# Patient Record
Sex: Male | Born: 1995 | Race: White | Hispanic: No | Marital: Single | Smoking: Current every day smoker
Health system: Southern US, Community
[De-identification: ages and names within clinical notes are randomized; demographics above are authoritative.]

## PROBLEM LIST (undated history)

## (undated) DIAGNOSIS — W3400XA Accidental discharge from unspecified firearms or gun, initial encounter: Secondary | ICD-10-CM

## (undated) DIAGNOSIS — N289 Disorder of kidney and ureter, unspecified: Secondary | ICD-10-CM

## (undated) DIAGNOSIS — I10 Essential (primary) hypertension: Secondary | ICD-10-CM

## (undated) HISTORY — PX: APPENDECTOMY: SHX54

## (undated) HISTORY — PX: LEG SURGERY: SHX1003

---
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
## 2014-05-03 ENCOUNTER — Encounter (HOSPITAL_COMMUNITY): Payer: Self-pay | Admitting: Neurology

## 2014-05-03 ENCOUNTER — Emergency Department (HOSPITAL_COMMUNITY)
Admission: EM | Admit: 2014-05-03 | Discharge: 2014-05-03 | Disposition: A | Discharge status: Home / Self Care | Payer: 59 | Attending: Emergency Medicine | Admitting: Emergency Medicine

## 2014-05-03 ENCOUNTER — Emergency Department (HOSPITAL_COMMUNITY): Payer: 59

## 2014-05-03 DIAGNOSIS — Z23 Encounter for immunization: Secondary | ICD-10-CM | POA: Diagnosis not present

## 2014-05-03 DIAGNOSIS — Y998 Other external cause status: Secondary | ICD-10-CM | POA: Diagnosis not present

## 2014-05-03 DIAGNOSIS — Y9289 Other specified places as the place of occurrence of the external cause: Secondary | ICD-10-CM | POA: Diagnosis not present

## 2014-05-03 DIAGNOSIS — S51832A Puncture wound without foreign body of left forearm, initial encounter: Secondary | ICD-10-CM | POA: Diagnosis not present

## 2014-05-03 DIAGNOSIS — M79632 Pain in left forearm: Secondary | ICD-10-CM

## 2014-05-03 DIAGNOSIS — Y9389 Activity, other specified: Secondary | ICD-10-CM | POA: Diagnosis not present

## 2014-05-03 DIAGNOSIS — W320XXA Accidental handgun discharge, initial encounter: Secondary | ICD-10-CM | POA: Insufficient documentation

## 2014-05-03 DIAGNOSIS — W3400XA Accidental discharge from unspecified firearms or gun, initial encounter: Secondary | ICD-10-CM

## 2014-05-03 LAB — COMPREHENSIVE METABOLIC PANEL
ALT: 46 U/L (ref 0–53)
AST: 30 U/L (ref 0–37)
Albumin: 4.7 g/dL (ref 3.5–5.2)
Alkaline Phosphatase: 80 U/L (ref 39–117)
Anion gap: 18 — ABNORMAL HIGH (ref 5–15)
BILIRUBIN TOTAL: 0.9 mg/dL (ref 0.3–1.2)
BUN: 9 mg/dL (ref 6–23)
CHLORIDE: 99 meq/L (ref 96–112)
CO2: 22 meq/L (ref 19–32)
CREATININE: 0.86 mg/dL (ref 0.50–1.35)
Calcium: 10 mg/dL (ref 8.4–10.5)
GLUCOSE: 96 mg/dL (ref 70–99)
Potassium: 3.9 mEq/L (ref 3.7–5.3)
Sodium: 139 mEq/L (ref 137–147)
Total Protein: 7.9 g/dL (ref 6.0–8.3)

## 2014-05-03 LAB — TYPE AND SCREEN
ABO/RH(D): O POS
Antibody Screen: NEGATIVE

## 2014-05-03 LAB — CBC
HEMATOCRIT: 48.5 % (ref 39.0–52.0)
HEMOGLOBIN: 17.7 g/dL — AB (ref 13.0–17.0)
MCH: 32.4 pg (ref 26.0–34.0)
MCHC: 36.5 g/dL — ABNORMAL HIGH (ref 30.0–36.0)
MCV: 88.7 fL (ref 78.0–100.0)
PLATELETS: 285 10*3/uL (ref 150–400)
RBC: 5.47 MIL/uL (ref 4.22–5.81)
RDW: 12.3 % (ref 11.5–15.5)
WBC: 6.7 10*3/uL (ref 4.0–10.5)

## 2014-05-03 LAB — ETHANOL

## 2014-05-03 LAB — LACTIC ACID, PLASMA
Lactic Acid, Venous: 2.7 mmol/L — ABNORMAL HIGH (ref 0.5–2.2)
Lactic Acid, Venous: 1.4 mmol/L (ref 0.5–2.2)

## 2014-05-03 LAB — ABO/RH: ABO/RH(D): O POS

## 2014-05-03 MED ORDER — OXYCODONE-ACETAMINOPHEN 5-325 MG PO TABS: 1.0000 | ORAL_TABLET | Freq: Four times a day (QID) | ORAL | Status: DC | PRN

## 2014-05-03 MED ORDER — TETANUS-DIPHTH-ACELL PERTUSSIS 5-2.5-18.5 LF-MCG/0.5 IM SUSP
0.5000 mL | Freq: Once | INTRAMUSCULAR | Status: DC
  Filled 2014-05-03: qty 0.5

## 2014-05-03 MED ORDER — FENTANYL CITRATE 0.05 MG/ML IJ SOLN
50.0000 ug | Freq: Once | INTRAMUSCULAR | Status: AC
  Administered 2014-05-03: 50 ug via INTRAVENOUS
  Filled 2014-05-03: qty 2

## 2014-05-03 MED ORDER — FENTANYL CITRATE 0.05 MG/ML IJ SOLN
50.0000 ug | Freq: Once | INTRAMUSCULAR | Status: AC
  Administered 2014-05-03: 50 ug via INTRAVENOUS

## 2014-05-03 MED ORDER — SODIUM CHLORIDE 0.9 % IV BOLUS (SEPSIS)
1000.0000 mL | Freq: Once | INTRAVENOUS | Status: AC
  Administered 2014-05-03: 1000 mL via INTRAVENOUS

## 2014-05-03 MED ORDER — FENTANYL CITRATE 0.05 MG/ML IJ SOLN
INTRAMUSCULAR | Status: AC
  Filled 2014-05-03: qty 2

## 2014-05-03 MED ORDER — CEFAZOLIN SODIUM 1-5 GM-% IV SOLN
1.0000 g | Freq: Once | INTRAVENOUS | Status: AC
  Administered 2014-05-03: 1 g via INTRAVENOUS
  Filled 2014-05-03: qty 50

## 2014-05-03 MED ORDER — TETANUS-DIPHTH-ACELL PERTUSSIS 5-2.5-18.5 LF-MCG/0.5 IM SUSP
0.5000 mL | Freq: Once | INTRAMUSCULAR | Status: AC
  Administered 2014-05-03: 0.5 mL via INTRAMUSCULAR

## 2014-05-03 MED ORDER — CEPHALEXIN 250 MG PO CAPS: 250.0000 mg | ORAL_CAPSULE | Freq: Four times a day (QID) | ORAL | Status: DC

## 2014-05-03 MED ORDER — CEPHALEXIN 250 MG PO CAPS: 250.0000 mg | ORAL_CAPSULE | Freq: Four times a day (QID) | ORAL | Status: AC
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
## 2014-05-03 NOTE — Progress Notes (Signed)
Orthopedic Tech Progress Note Patient Details:  Jeffrey BadJoseph R Woods 1995/08/07 [id number]  Ortho Devices Type of Ortho Device: Ace wrap, Arm sling, Sugartong splint Ortho Device/Splint Location: lue Ortho Device/Splint Interventions: Application   Marsh Heckler 05/03/2014, 4:34 PM
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
## 2014-05-03 NOTE — ED Notes (Signed)
Ortho tech made aware. Is coming down to apply splint.
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
## 2014-05-11 ENCOUNTER — Ambulatory Visit (INDEPENDENT_AMBULATORY_CARE_PROVIDER_SITE_OTHER): Payer: 59 | Admitting: Orthopedic Surgery

## 2014-05-11 ENCOUNTER — Encounter: Payer: Self-pay | Admitting: Orthopedic Surgery

## 2014-05-11 VITALS — BP 134/78 | Ht 72.0 in | Wt 220.0 lb

## 2014-05-11 DIAGNOSIS — W3400XA Accidental discharge from unspecified firearms or gun, initial encounter: Secondary | ICD-10-CM

## 2014-05-11 DIAGNOSIS — T148 Other injury of unspecified body region: Secondary | ICD-10-CM | POA: Diagnosis not present

## 2014-05-11 NOTE — Patient Instructions (Addendum)
Keep taking antibiotics until course is finished  Return to work 05/18/14
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
## 2018-03-27 DIAGNOSIS — I10 Essential (primary) hypertension: Secondary | ICD-10-CM | POA: Diagnosis not present

## 2018-03-27 DIAGNOSIS — R51 Headache: Secondary | ICD-10-CM | POA: Diagnosis not present

## 2018-04-01 DIAGNOSIS — I10 Essential (primary) hypertension: Secondary | ICD-10-CM | POA: Diagnosis not present

## 2018-04-01 DIAGNOSIS — Z6829 Body mass index (BMI) 29.0-29.9, adult: Secondary | ICD-10-CM | POA: Diagnosis not present

## 2018-04-01 DIAGNOSIS — Z1389 Encounter for screening for other disorder: Secondary | ICD-10-CM | POA: Diagnosis not present

## 2018-04-01 DIAGNOSIS — F411 Generalized anxiety disorder: Secondary | ICD-10-CM | POA: Diagnosis not present

## 2018-04-01 DIAGNOSIS — E663 Overweight: Secondary | ICD-10-CM | POA: Diagnosis not present

## 2018-05-27 DIAGNOSIS — Z1389 Encounter for screening for other disorder: Secondary | ICD-10-CM | POA: Diagnosis not present

## 2018-05-27 DIAGNOSIS — E663 Overweight: Secondary | ICD-10-CM | POA: Diagnosis not present

## 2018-05-27 DIAGNOSIS — I10 Essential (primary) hypertension: Secondary | ICD-10-CM | POA: Diagnosis not present

## 2018-05-27 DIAGNOSIS — Z0000 Encounter for general adult medical examination without abnormal findings: Secondary | ICD-10-CM | POA: Diagnosis not present

## 2018-05-27 DIAGNOSIS — Z6829 Body mass index (BMI) 29.0-29.9, adult: Secondary | ICD-10-CM | POA: Diagnosis not present

## 2018-09-01 DIAGNOSIS — M79645 Pain in left finger(s): Secondary | ICD-10-CM | POA: Diagnosis not present

## 2018-09-01 DIAGNOSIS — S0083XA Contusion of other part of head, initial encounter: Secondary | ICD-10-CM | POA: Diagnosis not present

## 2019-01-28 ENCOUNTER — Emergency Department (HOSPITAL_COMMUNITY)
Admission: EM | Admit: 2019-01-28 | Discharge: 2019-01-28 | Discharge status: Against Medical Advice | Payer: 59 | Attending: Emergency Medicine | Admitting: Emergency Medicine

## 2019-01-28 ENCOUNTER — Other Ambulatory Visit: Payer: Self-pay

## 2019-01-28 ENCOUNTER — Encounter (HOSPITAL_COMMUNITY): Payer: Self-pay | Admitting: Emergency Medicine

## 2019-01-28 DIAGNOSIS — E860 Dehydration: Secondary | ICD-10-CM | POA: Insufficient documentation

## 2019-01-28 DIAGNOSIS — F17200 Nicotine dependence, unspecified, uncomplicated: Secondary | ICD-10-CM | POA: Insufficient documentation

## 2019-01-28 DIAGNOSIS — R51 Headache: Secondary | ICD-10-CM | POA: Insufficient documentation

## 2019-01-28 DIAGNOSIS — Z79899 Other long term (current) drug therapy: Secondary | ICD-10-CM | POA: Insufficient documentation

## 2019-01-28 DIAGNOSIS — R519 Headache, unspecified: Secondary | ICD-10-CM

## 2019-01-28 DIAGNOSIS — I10 Essential (primary) hypertension: Secondary | ICD-10-CM | POA: Insufficient documentation

## 2019-01-28 DIAGNOSIS — Z5320 Procedure and treatment not carried out because of patient's decision for unspecified reasons: Secondary | ICD-10-CM | POA: Insufficient documentation

## 2019-01-28 HISTORY — DX: Essential (primary) hypertension: I10

## 2019-01-28 LAB — BASIC METABOLIC PANEL
Anion gap: 10 (ref 5–15)
BUN: 15 mg/dL (ref 6–20)
CO2: 26 mmol/L (ref 22–32)
Calcium: 9.1 mg/dL (ref 8.9–10.3)
Chloride: 100 mmol/L (ref 98–111)
Creatinine, Ser: 1.03 mg/dL (ref 0.61–1.24)
GFR calc Af Amer: >60 mL/min (ref >60)
GFR calc non Af Amer: >60 mL/min (ref >60)
Glucose, Bld: 101 mg/dL — ABNORMAL HIGH (ref 70–99)
Potassium: 3.8 mmol/L (ref 3.5–5.1)
Sodium: 136 mmol/L (ref 135–145)

## 2019-01-28 LAB — CBC
HCT: 45.6 % (ref 39.0–52.0)
Hemoglobin: 16.1 g/dL (ref 13.0–17.0)
MCH: 31.6 pg (ref 26.0–34.0)
MCHC: 35.3 g/dL (ref 30.0–36.0)
MCV: 89.6 fL (ref 80.0–100.0)
Platelets: 278 10*3/uL (ref 150–400)
RBC: 5.09 MIL/uL (ref 4.22–5.81)
RDW: 11.9 % (ref 11.5–15.5)
WBC: 11.5 10*3/uL — ABNORMAL HIGH (ref 4.0–10.5)
nRBC: 0 % (ref 0.0–0.2)

## 2019-01-28 NOTE — Discharge Instructions (Signed)
Your blood pressure is elevated at 143/95. Please have this rechecked soon. You have elected to leave the Emergency Dept against medical advice. If your symptoms change or your have new problems, please return to the Emergency Dept for evaluation and care.

## 2019-01-28 NOTE — ED Provider Notes (Signed)
[facility] Provider [facility]   CSN: [id number] Arrival date & time: 01/28/19  1950     History   Chief Complaint Chief Complaint  Patient presents with  . Headache    HPI Danford BadJoseph R Demirjian is a 23 y.o. male.     Patient is a 23 year old male who presents to the emergency department with a complaint of headache and weakness.  The patient states that this problem started earlier today.  He says that on yesterday he was feeling fine and not having any problems.  The patient states that he has been working in a very hot attic.  He is not sure that he has been keeping himself well-hydrated today.  He started having problems with a headache, feeling weak and tired.  He went home, laid down to take a nap, but noticed that he was very weak.  He took a nap but upon awakening noted some neck pain from the lower neck extending up to the house base of the head.  He says he is also had some problems with constipation.  There is been no recent vomiting.  He says he has felt nauseated.  There is been no fever or chills reported.  There has been some neck pain, but no neck stiffness or rigidity.  There is been no unusual rash reported.  The patient states that he gets headaches from time to time.  This 1 seem to have been a little worse than usual, and he was particularly concerned because he was having the headache and having weakness.  Patient was brought to the emergency department by EMS.  Patient received IV fluids while in route to the emergency department.  The history is provided by the patient.  Headache Associated symptoms: fatigue, nausea and neck pain   Associated symptoms: no abdominal pain, no back pain, no congestion, no cough, no diarrhea, no dizziness, no ear pain, no eye pain, no fever, no myalgias, no numbness, no photophobia, no seizures, no vomiting and no weakness     Past Medical History:  Diagnosis Date  . Hypertension     Patient Active Problem List   Diagnosis Date Noted  . Difficulty in walking(719.7) 02/28/2011  . Stiffness of joint, not elsewhere classified, ankle and foot 02/28/2011  . Pain in joint, lower leg 02/28/2011    Past Surgical History:  Procedure Laterality Date  . LEG SURGERY          Home Medications    Prior to Admission medications   Medication Sig Start Date End Date Taking? Authorizing Provider  ibuprofen (ADVIL,MOTRIN) 200 MG tablet Take 800 mg by mouth every 6 (six) hours as needed for headache.   Yes [provider]  lisinopril (ZESTRIL) 10 MG tablet Take 10 mg by mouth daily.   Yes [provider]    Family History No family history on file.  Social History Social History   Tobacco Use  . Smoking status: Current Some Day Smoker  . Smokeless tobacco: Never Used  Substance Use Topics  . Alcohol use: Yes  . Drug use: Not on file     Allergies   Patient has no known allergies.   Review of Systems Review of Systems  Constitutional: Positive for fatigue. Negative for activity change, appetite change and fever.  HENT: Negative for congestion, ear discharge, ear pain, facial swelling, nosebleeds, rhinorrhea, sneezing and tinnitus.   Eyes: Negative for photophobia, pain and discharge.  Respiratory: Negative for cough, choking, shortness of breath  and wheezing.   Cardiovascular: Negative for chest pain, palpitations and leg swelling.  Gastrointestinal: Positive for constipation and nausea. Negative for abdominal pain, blood in stool, diarrhea and vomiting.  Genitourinary: Negative for difficulty urinating, dysuria, flank pain, frequency and hematuria.  Musculoskeletal: Positive for neck pain. Negative for back pain, gait problem and myalgias.  Skin: Negative for color change, rash and wound.  Neurological: Positive for headaches. Negative for dizziness, seizures, syncope, facial asymmetry, speech difficulty, weakness and numbness.  Hematological: Negative for adenopathy. Does  not bruise/bleed easily.  Psychiatric/Behavioral: Negative for agitation, confusion, hallucinations, self-injury and suicidal ideas. The patient is not nervous/anxious.      Physical Exam Updated Vital Signs BP (!) 143/95 (BP Location: Left Arm)   Pulse 74   Temp 98.7 F (37.1 C) (Oral)   Resp 18   Ht 6\' 1"  (1.854 m)   Wt 104.3 kg   SpO2 100%   BMI 30.34 kg/m   Physical Exam Vitals signs and nursing note reviewed.  Constitutional:      General: He is not in acute distress.    Appearance: He is well-developed.  HENT:     Head: Normocephalic and atraumatic.     Right Ear: External ear normal.     Left Ear: External ear normal.  Eyes:     General: No scleral icterus.       Right eye: No discharge.        Left eye: No discharge.     Conjunctiva/sclera: Conjunctivae normal.  Neck:     Musculoskeletal: Neck supple.     Trachea: No tracheal deviation.  Cardiovascular:     Rate and Rhythm: Normal rate and regular rhythm.  Pulmonary:     Effort: Pulmonary effort is normal. No respiratory distress.     Breath sounds: Normal breath sounds. No stridor. No wheezing or rales.  Abdominal:     General: Bowel sounds are normal. There is no distension.     Palpations: Abdomen is soft.     Tenderness: There is no abdominal tenderness. There is no guarding or rebound.  Musculoskeletal:        General: No tenderness.  Skin:    General: Skin is warm and dry.     Findings: No rash.  Neurological:     Mental Status: He is alert.     Cranial Nerves: No cranial nerve deficit (no facial droop, extraocular movements intact, no slurred speech).     Sensory: No sensory deficit.     Motor: No abnormal muscle tone or seizure activity.     Coordination: Coordination normal.      ED Treatments / Results  Labs (all labs ordered are listed, but only abnormal results are displayed) Labs Reviewed  CBC - Abnormal; Notable for the following components:      Result Value   WBC 11.5 (*)     All other components within normal limits  BASIC METABOLIC PANEL - Abnormal; Notable for the following components:   Glucose, Bld 101 (*)    All other components within normal limits    EKG None  Radiology No results found.  Procedures Procedures (including critical care time)  Medications Ordered in ED Medications - No data to display   Initial Impression / Assessment and Plan / ED Course  I have reviewed the triage vital signs and the nursing notes.  Pertinent labs & imaging results that were available during my care of the patient were reviewed by me and considered in my  medical decision making (see chart for details).          Final Clinical Impressions(s) / ED Diagnoses MDM  Blood pressure is elevated at 143/95, otherwise vital signs are within normal limits.  No neurologic deficits appreciated.  No vascular deficits appreciated.  No rigidity or other signs of meningitis or other acute changes.  After having received the IV fluids, the patient states that he feels much much better.  I discussed with the patient the importance of having the orthostatic blood pressure checks, to review his lab work, and to a COVID-19 virus screen.  The patient initially was in favor of having this test done.  As I was preparing to put in the orders, the nurse came to me and reported that the patient wanted to leave.  He says that he is hungry and he wants to return home.  I discussed with the patient the importance of the information that we wanted to pain.  I discussed with him the possible dangers.  The patient states that he accepts those responsibilities but he wants to go home.  Patient left the emergency department [place].  The lab work and had been obtained earlier upon his arrival to the emergency department returned showing the complete blood count with an slight elevation of the white blood cells of 11,500, no shift to the left.  Otherwise the test is within normal  limits. The basic metabolic panel shows no acute abnormality.  The anion gap is normal at 10.  Patient is ambulatory at the time of leaving the emergency department without problem.  Patient was invited to return to the emergency department if any changes in his condition, problems, or concerns.   Final diagnoses:  Dehydration  Bad headache    ED Discharge Orders    None       Lily Kocher, Hershal Coria 01/29/19 1213    Virgel Manifold, MD 01/30/19 [phone]

## 2019-01-28 NOTE — ED Notes (Signed)
Pt stating he wants to leave, because "he feels fine." EDP Lily Kocher, PA made aware and went in to speak to him. Pt still stating he still wanted to leave

## 2019-01-28 NOTE — ED Triage Notes (Addendum)
Pt C/o headache, weakness, neck pain, and constipation. Denies fevers and vomiting.

## 2019-05-26 ENCOUNTER — Encounter (HOSPITAL_COMMUNITY): Payer: Self-pay

## 2019-05-26 ENCOUNTER — Emergency Department (HOSPITAL_COMMUNITY)
Admission: EM | Admit: 2019-05-26 | Discharge: 2019-05-26 | Disposition: A | Discharge status: Home / Self Care | Payer: Self-pay | Attending: Emergency Medicine | Admitting: Emergency Medicine

## 2019-05-26 ENCOUNTER — Other Ambulatory Visit: Payer: Self-pay

## 2019-05-26 DIAGNOSIS — G8929 Other chronic pain: Secondary | ICD-10-CM

## 2019-05-26 DIAGNOSIS — I10 Essential (primary) hypertension: Secondary | ICD-10-CM | POA: Insufficient documentation

## 2019-05-26 DIAGNOSIS — F17290 Nicotine dependence, other tobacco product, uncomplicated: Secondary | ICD-10-CM | POA: Insufficient documentation

## 2019-05-26 DIAGNOSIS — R519 Headache, unspecified: Secondary | ICD-10-CM | POA: Insufficient documentation

## 2019-05-26 DIAGNOSIS — Z79899 Other long term (current) drug therapy: Secondary | ICD-10-CM | POA: Insufficient documentation

## 2019-05-26 HISTORY — DX: Accidental discharge from unspecified firearms or gun, initial encounter: W34.00XA

## 2019-05-26 LAB — I-STAT CHEM 8, ED
BUN: 8 mg/dL (ref 6–20)
Calcium, Ion: 1.2 mmol/L (ref 1.15–1.40)
Chloride: 103 mmol/L (ref 98–111)
Creatinine, Ser: 0.8 mg/dL (ref 0.61–1.24)
Glucose, Bld: 86 mg/dL (ref 70–99)
HCT: 44 % (ref 39.0–52.0)
Hemoglobin: 15 g/dL (ref 13.0–17.0)
Potassium: 3.8 mmol/L (ref 3.5–5.1)
Sodium: 140 mmol/L (ref 135–145)
TCO2: 26 mmol/L (ref 22–32)

## 2019-05-26 MED ORDER — SODIUM CHLORIDE 0.9 % IV BOLUS
500.0000 mL | Freq: Once | INTRAVENOUS | Status: AC
Start: 2019-05-26 — End: 2019-05-26
  Administered 2019-05-26: 500 mL via INTRAVENOUS

## 2019-05-26 MED ORDER — VERAPAMIL HCL ER 180 MG PO TBCR: 180.0000 mg | EXTENDED_RELEASE_TABLET | Freq: Every day | ORAL | 0 refills | Status: AC

## 2019-05-26 MED ORDER — DIPHENHYDRAMINE HCL 50 MG/ML IJ SOLN
12.5000 mg | Freq: Once | INTRAMUSCULAR | Status: AC
  Administered 2019-05-26: 12.5 mg via INTRAVENOUS
  Filled 2019-05-26: qty 1

## 2019-05-26 MED ORDER — ONDANSETRON HCL 4 MG/2ML IJ SOLN
4.0000 mg | Freq: Once | INTRAMUSCULAR | Status: AC
  Administered 2019-05-26: 4 mg via INTRAVENOUS
  Filled 2019-05-26: qty 2

## 2019-05-26 MED ORDER — KETOROLAC TROMETHAMINE 15 MG/ML IJ SOLN
15.0000 mg | Freq: Once | INTRAMUSCULAR | Status: AC
  Administered 2019-05-26: 15 mg via INTRAVENOUS
  Filled 2019-05-26: qty 1

## 2019-05-26 NOTE — ED Triage Notes (Signed)
Pt presents to ED with complaints of hypertension. Pt states his BP was 168/100 this am. Pt states he has had a headache today also.

## 2019-05-26 NOTE — Discharge Instructions (Addendum)
Please take your newly increased verapamil, as prescribed.  Please schedule follow-up appointment with your primary care provider for ongoing evaluation and management of your hypertension.    Please discontinue smoking your vape and as that may be contributing factor.  Also encourage you to follow-up with your PCP regarding your reported anxiety.  Given your musculoskeletal discomfort, encourage lifestyle modifications to curb stress on your trapezial muscles.  Consider massage and continue ibuprofen, as needed.  Please return to the ED or seek medical attention should he develop any fevers or chills, new or worsening chest pain or shortness of breath symptoms, or neurologic deficits.
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
## 2019-09-08 ENCOUNTER — Ambulatory Visit (INDEPENDENT_AMBULATORY_CARE_PROVIDER_SITE_OTHER): Payer: Self-pay

## 2019-09-08 ENCOUNTER — Ambulatory Visit
Admission: EM | Admit: 2019-09-08 | Discharge: 2019-09-08 | Disposition: A | Discharge status: Home / Self Care | Payer: Self-pay | Attending: Emergency Medicine | Admitting: Emergency Medicine

## 2019-09-08 DIAGNOSIS — M79671 Pain in right foot: Secondary | ICD-10-CM

## 2019-09-08 DIAGNOSIS — M25571 Pain in right ankle and joints of right foot: Secondary | ICD-10-CM

## 2019-09-08 MED ORDER — PREDNISONE 10 MG PO TABS: 20.0000 mg | ORAL_TABLET | Freq: Every day | ORAL | 0 refills | Status: DC

## 2019-09-08 MED ORDER — IBUPROFEN 800 MG PO TABS: 800.0000 mg | ORAL_TABLET | Freq: Three times a day (TID) | ORAL | 0 refills | Status: DC

## 2019-09-08 NOTE — ED Triage Notes (Signed)
Pt injures right foot 1 week ago, pain not improved  , pain is 4th and 5th toe
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
## 2019-09-08 NOTE — Discharge Instructions (Signed)
X-ray is negative for acute fracture or dislocation. Ibuprofen 800 mg was prescribed Prednisone was prescribed Follow RICE instructions as attached Follow-up with primary care Return for worsening of symptoms

## 2019-10-05 ENCOUNTER — Encounter (HOSPITAL_COMMUNITY): Payer: Self-pay

## 2019-10-05 ENCOUNTER — Emergency Department (HOSPITAL_COMMUNITY): Payer: No Typology Code available for payment source

## 2019-10-05 ENCOUNTER — Emergency Department (HOSPITAL_COMMUNITY)
Admission: EM | Admit: 2019-10-05 | Discharge: 2019-10-06 | Disposition: A | Discharge status: Home / Self Care | Payer: No Typology Code available for payment source | Attending: Emergency Medicine | Admitting: Emergency Medicine

## 2019-10-05 DIAGNOSIS — I10 Essential (primary) hypertension: Secondary | ICD-10-CM | POA: Diagnosis not present

## 2019-10-05 DIAGNOSIS — Z79899 Other long term (current) drug therapy: Secondary | ICD-10-CM | POA: Insufficient documentation

## 2019-10-05 DIAGNOSIS — Y999 Unspecified external cause status: Secondary | ICD-10-CM | POA: Insufficient documentation

## 2019-10-05 DIAGNOSIS — S99911A Unspecified injury of right ankle, initial encounter: Secondary | ICD-10-CM | POA: Diagnosis present

## 2019-10-05 DIAGNOSIS — Z87891 Personal history of nicotine dependence: Secondary | ICD-10-CM | POA: Diagnosis not present

## 2019-10-05 DIAGNOSIS — Y9389 Activity, other specified: Secondary | ICD-10-CM | POA: Insufficient documentation

## 2019-10-05 DIAGNOSIS — M546 Pain in thoracic spine: Secondary | ICD-10-CM | POA: Insufficient documentation

## 2019-10-05 DIAGNOSIS — S4991XA Unspecified injury of right shoulder and upper arm, initial encounter: Secondary | ICD-10-CM | POA: Insufficient documentation

## 2019-10-05 DIAGNOSIS — Y92410 Unspecified street and highway as the place of occurrence of the external cause: Secondary | ICD-10-CM | POA: Insufficient documentation

## 2019-10-05 DIAGNOSIS — S82841A Displaced bimalleolar fracture of right lower leg, initial encounter for closed fracture: Secondary | ICD-10-CM | POA: Diagnosis not present

## 2019-10-05 MED ORDER — ONDANSETRON HCL 4 MG/2ML IJ SOLN
4.0000 mg | Freq: Once | INTRAMUSCULAR | Status: AC
  Administered 2019-10-05: 4 mg via INTRAVENOUS
  Filled 2019-10-05: qty 2

## 2019-10-05 MED ORDER — HYDROMORPHONE HCL 1 MG/ML IJ SOLN
1.0000 mg | Freq: Once | INTRAMUSCULAR | Status: AC
  Administered 2019-10-05: 1 mg via INTRAVENOUS
  Filled 2019-10-05: qty 1
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
## 2019-10-05 NOTE — ED Notes (Signed)
C-Collar removed by [employer]

## 2019-10-06 MED ORDER — HYDROMORPHONE HCL 1 MG/ML IJ SOLN
1.0000 mg | Freq: Once | INTRAMUSCULAR | Status: AC
  Administered 2019-10-06: 1 mg via INTRAVENOUS
  Filled 2019-10-06: qty 1

## 2019-10-06 MED ORDER — OXYCODONE-ACETAMINOPHEN 10-325 MG PO TABS: 1.0000 | ORAL_TABLET | ORAL | 0 refills | Status: DC | PRN

## 2019-10-06 NOTE — ED Notes (Signed)
Ortho tech paged  

## 2019-10-06 NOTE — Progress Notes (Signed)
Orthopedic Tech Progress Note Patient Details:  Jeffrey Woods [birth year] [id number]  Ortho Devices Type of Ortho Device: Crutches, Stirrup splint, Post (short leg) splint, Sling immobilizer Ortho Device/Splint Location: rue. rle. Ortho Device/Splint Interventions: Ordered, Application, Adjustment   Post Interventions Patient Tolerated: Well Instructions Provided: Care of device, Adjustment of device   [name] Post 10/06/2019, 3:28 AM

## 2019-10-06 NOTE — ED Notes (Signed)
Patient verbalizes understanding of discharge instructions. Opportunity for questioning and answers were provided. Armband removed by staff, pt discharged from ED. Pt. ambulatory and discharged home.  

## 2019-10-06 NOTE — Discharge Instructions (Addendum)
Return here as needed.  Call the orthopedic doctor provided first thing in the morning for an appointment.
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
## 2019-10-08 ENCOUNTER — Other Ambulatory Visit (HOSPITAL_COMMUNITY): Payer: Self-pay | Admitting: Orthopedic Surgery

## 2019-10-10 ENCOUNTER — Other Ambulatory Visit: Payer: Self-pay

## 2019-10-10 ENCOUNTER — Encounter (HOSPITAL_BASED_OUTPATIENT_CLINIC_OR_DEPARTMENT_OTHER): Payer: Self-pay | Admitting: Orthopedic Surgery

## 2019-10-14 ENCOUNTER — Other Ambulatory Visit: Payer: Self-pay

## 2019-10-14 ENCOUNTER — Encounter (HOSPITAL_COMMUNITY): Payer: Self-pay | Admitting: *Deleted

## 2019-10-14 DIAGNOSIS — K5903 Drug induced constipation: Secondary | ICD-10-CM | POA: Insufficient documentation

## 2019-10-14 DIAGNOSIS — I10 Essential (primary) hypertension: Secondary | ICD-10-CM | POA: Insufficient documentation

## 2019-10-14 DIAGNOSIS — Z79899 Other long term (current) drug therapy: Secondary | ICD-10-CM | POA: Insufficient documentation

## 2019-10-14 DIAGNOSIS — F17210 Nicotine dependence, cigarettes, uncomplicated: Secondary | ICD-10-CM | POA: Insufficient documentation
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
## 2019-10-15 ENCOUNTER — Emergency Department (HOSPITAL_COMMUNITY)
Admission: EM | Admit: 2019-10-15 | Discharge: 2019-10-15 | Disposition: A | Discharge status: Home / Self Care | Payer: Self-pay | Attending: Emergency Medicine | Admitting: Emergency Medicine

## 2019-10-15 ENCOUNTER — Emergency Department (HOSPITAL_COMMUNITY): Payer: Self-pay

## 2019-10-15 ENCOUNTER — Other Ambulatory Visit: Payer: Self-pay

## 2019-10-15 ENCOUNTER — Other Ambulatory Visit (HOSPITAL_COMMUNITY)
Admission: RE | Admit: 2019-10-15 | Discharge: 2019-10-15 | Disposition: A | Discharge status: Home / Self Care | Payer: HRSA Program | Source: Ambulatory Visit | Attending: Orthopedic Surgery | Admitting: Orthopedic Surgery

## 2019-10-15 DIAGNOSIS — Z01812 Encounter for preprocedural laboratory examination: Secondary | ICD-10-CM | POA: Diagnosis present

## 2019-10-15 DIAGNOSIS — Z20822 Contact with and (suspected) exposure to covid-19: Secondary | ICD-10-CM | POA: Diagnosis not present

## 2019-10-15 DIAGNOSIS — Z4789 Encounter for other orthopedic aftercare: Secondary | ICD-10-CM

## 2019-10-15 DIAGNOSIS — K5903 Drug induced constipation: Secondary | ICD-10-CM

## 2019-10-15 MED ORDER — BISACODYL 10 MG RE SUPP
10.0000 mg | Freq: Once | RECTAL | Status: AC
  Administered 2019-10-15: 10 mg via RECTAL
  Filled 2019-10-15: qty 1

## 2019-10-15 NOTE — Discharge Instructions (Addendum)
You can use the Dulcolax suppositories up your rectum to stimulate bowel movements.  If that is not working you can try a fleets enema.  If that is not working then try MiraLAX. Get miralax and put one dose or 17 g in 8 ounces of water,  take 1 dose every 30 minutes for 2-3 hours or until you  get good results and then once or twice daily to prevent constipation.
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
## 2019-10-16 ENCOUNTER — Ambulatory Visit (HOSPITAL_BASED_OUTPATIENT_CLINIC_OR_DEPARTMENT_OTHER)
Admission: RE | Admit: 2019-10-16 | Discharge: 2019-10-16 | Disposition: A | Discharge status: Home / Self Care | Payer: Self-pay | Attending: Orthopedic Surgery | Admitting: Orthopedic Surgery

## 2019-10-16 ENCOUNTER — Encounter (HOSPITAL_BASED_OUTPATIENT_CLINIC_OR_DEPARTMENT_OTHER)
Admission: RE | Disposition: A | Discharge status: Home / Self Care | Payer: Self-pay | Source: Home / Self Care | Attending: Orthopedic Surgery

## 2019-10-16 ENCOUNTER — Ambulatory Visit (HOSPITAL_BASED_OUTPATIENT_CLINIC_OR_DEPARTMENT_OTHER): Payer: Self-pay | Admitting: Anesthesiology

## 2019-10-16 ENCOUNTER — Other Ambulatory Visit: Payer: Self-pay

## 2019-10-16 ENCOUNTER — Encounter (HOSPITAL_BASED_OUTPATIENT_CLINIC_OR_DEPARTMENT_OTHER): Payer: Self-pay | Admitting: Orthopedic Surgery

## 2019-10-16 DIAGNOSIS — Y939 Activity, unspecified: Secondary | ICD-10-CM | POA: Insufficient documentation

## 2019-10-16 DIAGNOSIS — Z79899 Other long term (current) drug therapy: Secondary | ICD-10-CM | POA: Insufficient documentation

## 2019-10-16 DIAGNOSIS — Z91018 Allergy to other foods: Secondary | ICD-10-CM | POA: Insufficient documentation

## 2019-10-16 DIAGNOSIS — T8484XA Pain due to internal orthopedic prosthetic devices, implants and grafts, initial encounter: Secondary | ICD-10-CM | POA: Insufficient documentation

## 2019-10-16 DIAGNOSIS — S82841A Displaced bimalleolar fracture of right lower leg, initial encounter for closed fracture: Secondary | ICD-10-CM | POA: Insufficient documentation

## 2019-10-16 DIAGNOSIS — F17210 Nicotine dependence, cigarettes, uncomplicated: Secondary | ICD-10-CM | POA: Insufficient documentation

## 2019-10-16 DIAGNOSIS — I10 Essential (primary) hypertension: Secondary | ICD-10-CM | POA: Insufficient documentation

## 2019-10-16 HISTORY — PX: ORIF ANKLE FRACTURE: SHX5408

## 2019-10-16 LAB — SARS CORONAVIRUS 2 (TAT 6-24 HRS): SARS Coronavirus 2: NEGATIVE

## 2019-10-16 SURGERY — OPEN REDUCTION INTERNAL FIXATION (ORIF) ANKLE FRACTURE
Anesthesia: General | Site: Ankle | Laterality: Right

## 2019-10-16 MED ORDER — PROPOFOL 500 MG/50ML IV EMUL
INTRAVENOUS | Status: DC | PRN
  Administered 2019-10-16: 25 ug/kg/min via INTRAVENOUS

## 2019-10-16 MED ORDER — LACTATED RINGERS IV SOLN: INTRAVENOUS | Status: DC

## 2019-10-16 MED ORDER — FENTANYL CITRATE (PF) 100 MCG/2ML IJ SOLN
INTRAMUSCULAR | Status: AC
  Filled 2019-10-16: qty 2

## 2019-10-16 MED ORDER — ROPIVACAINE HCL 7.5 MG/ML IJ SOLN
INTRAMUSCULAR | Status: DC | PRN
  Administered 2019-10-16: 20 mL via PERINEURAL
  Administered 2019-10-16: 10 mL via PERINEURAL

## 2019-10-16 MED ORDER — ONDANSETRON HCL 4 MG/2ML IJ SOLN: 4.0000 mg | Freq: Once | INTRAMUSCULAR | Status: DC | PRN

## 2019-10-16 MED ORDER — MIDAZOLAM HCL 2 MG/2ML IJ SOLN
1.0000 mg | INTRAMUSCULAR | Status: DC | PRN
  Administered 2019-10-16: 2 mg via INTRAVENOUS

## 2019-10-16 MED ORDER — OXYCODONE HCL 5 MG PO TABS: 5.0000 mg | ORAL_TABLET | Freq: Once | ORAL | Status: DC | PRN

## 2019-10-16 MED ORDER — NAPROXEN SODIUM 220 MG PO TABS: 440.0000 mg | ORAL_TABLET | Freq: Two times a day (BID) | ORAL | Status: DC

## 2019-10-16 MED ORDER — SODIUM CHLORIDE 0.9 % IV SOLN: INTRAVENOUS | Status: DC

## 2019-10-16 MED ORDER — FENTANYL CITRATE (PF) 100 MCG/2ML IJ SOLN
50.0000 ug | INTRAMUSCULAR | Status: DC | PRN
  Administered 2019-10-16: 100 ug via INTRAVENOUS

## 2019-10-16 MED ORDER — CEFAZOLIN SODIUM-DEXTROSE 2-4 GM/100ML-% IV SOLN
INTRAVENOUS | Status: AC
  Filled 2019-10-16: qty 100

## 2019-10-16 MED ORDER — ACETAMINOPHEN 160 MG/5ML PO SOLN: 325.0000 mg | ORAL | Status: DC | PRN

## 2019-10-16 MED ORDER — FENTANYL CITRATE (PF) 100 MCG/2ML IJ SOLN: 25.0000 ug | INTRAMUSCULAR | Status: DC | PRN

## 2019-10-16 MED ORDER — VANCOMYCIN HCL 500 MG IV SOLR
INTRAVENOUS | Status: DC | PRN
  Administered 2019-10-16: 500 mg via TOPICAL

## 2019-10-16 MED ORDER — PROPOFOL 10 MG/ML IV BOLUS
INTRAVENOUS | Status: DC | PRN
  Administered 2019-10-16: 200 mg via INTRAVENOUS

## 2019-10-16 MED ORDER — ONDANSETRON HCL 4 MG/2ML IJ SOLN
INTRAMUSCULAR | Status: DC | PRN
  Administered 2019-10-16: 4 mg via INTRAVENOUS

## 2019-10-16 MED ORDER — VANCOMYCIN HCL 500 MG IV SOLR
INTRAVENOUS | Status: AC
  Filled 2019-10-16: qty 500

## 2019-10-16 MED ORDER — ACETAMINOPHEN 325 MG PO TABS
ORAL_TABLET | ORAL | Status: AC
  Filled 2019-10-16: qty 2

## 2019-10-16 MED ORDER — LIDOCAINE HCL (CARDIAC) PF 100 MG/5ML IV SOSY
PREFILLED_SYRINGE | INTRAVENOUS | Status: DC | PRN
  Administered 2019-10-16: 30 mg via INTRAVENOUS

## 2019-10-16 MED ORDER — ENOXAPARIN SODIUM 40 MG/0.4ML ~~LOC~~ SOLN
40.0000 mg | SUBCUTANEOUS | 0 refills | Status: DC
Start: 2019-10-17 — End: 2023-12-11

## 2019-10-16 MED ORDER — DEXAMETHASONE SODIUM PHOSPHATE 10 MG/ML IJ SOLN
INTRAMUSCULAR | Status: DC | PRN
Start: 2019-10-16 — End: 2019-10-16
  Administered 2019-10-16: 10 mg
  Administered 2019-10-16: 4 mg

## 2019-10-16 MED ORDER — CEFAZOLIN SODIUM-DEXTROSE 2-4 GM/100ML-% IV SOLN
2.0000 g | INTRAVENOUS | Status: AC
  Administered 2019-10-16: 13:00:00 2 g via INTRAVENOUS

## 2019-10-16 MED ORDER — MIDAZOLAM HCL 2 MG/2ML IJ SOLN
INTRAMUSCULAR | Status: AC
  Filled 2019-10-16: qty 2

## 2019-10-16 MED ORDER — MEPERIDINE HCL 25 MG/ML IJ SOLN: 6.2500 mg | INTRAMUSCULAR | Status: DC | PRN

## 2019-10-16 MED ORDER — 0.9 % SODIUM CHLORIDE (POUR BTL) OPTIME
TOPICAL | Status: DC | PRN
  Administered 2019-10-16: 14:00:00 200 mL

## 2019-10-16 MED ORDER — ACETAMINOPHEN 325 MG PO TABS
325.0000 mg | ORAL_TABLET | ORAL | Status: DC | PRN
  Administered 2019-10-16: 650 mg via ORAL

## 2019-10-16 MED ORDER — OXYCODONE HCL 5 MG PO TABS
5.0000 mg | ORAL_TABLET | Freq: Four times a day (QID) | ORAL | 0 refills | Status: AC | PRN
Start: 2019-10-16 — End: 2019-10-21

## 2019-10-16 MED ORDER — OXYCODONE HCL 5 MG/5ML PO SOLN: 5.0000 mg | Freq: Once | ORAL | Status: DC | PRN

## 2019-10-16 SURGICAL SUPPLY — 82 items
BANDAGE ESMARK 6X9 LF (GAUZE/BANDAGES/DRESSINGS) IMPLANT
BIT DRILL 2.5X2.75 QC CALB (BIT) ×2 IMPLANT
BIT DRILL 2.9 CANN QC NONSTRL (BIT) ×2 IMPLANT
BLADE SURG 15 STRL LF DISP TIS (BLADE) ×2 IMPLANT
BLADE SURG 15 STRL SS (BLADE) ×4
BNDG COHESIVE 4X5 TAN STRL (GAUZE/BANDAGES/DRESSINGS) ×2 IMPLANT
BNDG COHESIVE 6X5 TAN STRL LF (GAUZE/BANDAGES/DRESSINGS) ×2 IMPLANT
BNDG ESMARK 4X9 LF (GAUZE/BANDAGES/DRESSINGS) IMPLANT
BNDG ESMARK 6X9 LF (GAUZE/BANDAGES/DRESSINGS)
CANISTER SUCT 1200ML W/VALVE (MISCELLANEOUS) ×2 IMPLANT
CHLORAPREP W/TINT 26 (MISCELLANEOUS) ×2 IMPLANT
COVER BACK TABLE 60X90IN (DRAPES) ×2 IMPLANT
COVER WAND RF STERILE (DRAPES) IMPLANT
CUFF TOURN SGL QUICK 34 (TOURNIQUET CUFF) ×2
CUFF TRNQT CYL 34X4.125X (TOURNIQUET CUFF) ×1 IMPLANT
DECANTER SPIKE VIAL GLASS SM (MISCELLANEOUS) IMPLANT
DRAPE EXTREMITY T 121X128X90 (DISPOSABLE) ×2 IMPLANT
DRAPE OEC MINIVIEW 54X84 (DRAPES) ×2 IMPLANT
DRAPE U-SHAPE 47X51 STRL (DRAPES) ×2 IMPLANT
DRSG MEPITEL 4X7.2 (GAUZE/BANDAGES/DRESSINGS) ×2 IMPLANT
DRSG PAD ABDOMINAL 8X10 ST (GAUZE/BANDAGES/DRESSINGS) ×4 IMPLANT
ELECT REM PT RETURN 9FT ADLT (ELECTROSURGICAL) ×2
ELECTRODE REM PT RTRN 9FT ADLT (ELECTROSURGICAL) ×1 IMPLANT
GAUZE SPONGE 4X4 12PLY STRL (GAUZE/BANDAGES/DRESSINGS) ×2 IMPLANT
GLOVE BIO SURGEON STRL SZ8 (GLOVE) ×2 IMPLANT
GLOVE BIOGEL PI IND STRL 6.5 (GLOVE) ×1 IMPLANT
GLOVE BIOGEL PI IND STRL 7.0 (GLOVE) ×1 IMPLANT
GLOVE BIOGEL PI IND STRL 8 (GLOVE) ×1 IMPLANT
GLOVE BIOGEL PI INDICATOR 6.5 (GLOVE) ×1
GLOVE BIOGEL PI INDICATOR 7.0 (GLOVE) ×1
GLOVE BIOGEL PI INDICATOR 8 (GLOVE) ×1
GLOVE ECLIPSE 6.5 STRL STRAW (GLOVE) ×4 IMPLANT
GLOVE ECLIPSE 8.0 STRL XLNG CF (GLOVE) IMPLANT
GOWN STRL REUS W/ TWL LRG LVL3 (GOWN DISPOSABLE) ×1 IMPLANT
GOWN STRL REUS W/ TWL XL LVL3 (GOWN DISPOSABLE) ×1 IMPLANT
GOWN STRL REUS W/TWL LRG LVL3 (GOWN DISPOSABLE) ×2
GOWN STRL REUS W/TWL XL LVL3 (GOWN DISPOSABLE) ×2
K-WIRE ACE 1.6X6 (WIRE) ×4
KWIRE ACE 1.6X6 (WIRE) ×2 IMPLANT
NEEDLE HYPO 22GX1.5 SAFETY (NEEDLE) IMPLANT
NS IRRIG 1000ML POUR BTL (IV SOLUTION) ×2 IMPLANT
PAD CAST 4YDX4 CTTN HI CHSV (CAST SUPPLIES) ×1 IMPLANT
PADDING CAST ABS 4INX4YD NS (CAST SUPPLIES)
PADDING CAST ABS COTTON 4X4 ST (CAST SUPPLIES) IMPLANT
PADDING CAST COTTON 4X4 STRL (CAST SUPPLIES) ×2
PADDING CAST COTTON 6X4 STRL (CAST SUPPLIES) ×2 IMPLANT
PENCIL SMOKE EVACUATOR (MISCELLANEOUS) ×2 IMPLANT
PLATE ACE 100DEG 6HOLE (Plate) ×2 IMPLANT
PLATE ACE 3.5MM 4HOLE (Plate) ×2 IMPLANT
SANITIZER HAND PURELL 535ML FO (MISCELLANEOUS) ×2 IMPLANT
SCREW CANC LAG 4X50 (Screw) ×2 IMPLANT
SCREW CORTICAL 3.5MM  16MM (Screw) ×4 IMPLANT
SCREW CORTICAL 3.5MM  20MM (Screw) ×4 IMPLANT
SCREW CORTICAL 3.5MM  34MM (Screw) ×2 IMPLANT
SCREW CORTICAL 3.5MM 16MM (Screw) ×2 IMPLANT
SCREW CORTICAL 3.5MM 18MM (Screw) ×2 IMPLANT
SCREW CORTICAL 3.5MM 20MM (Screw) ×2 IMPLANT
SCREW CORTICAL 3.5MM 22MM (Screw) ×2 IMPLANT
SCREW CORTICAL 3.5MM 34MM (Screw) ×1 IMPLANT
SCREW CORTICAL 3.5MM 40MM (Screw) ×2 IMPLANT
SET BASIN DAY SURGERY F.S. (CUSTOM PROCEDURE TRAY) ×2 IMPLANT
SHEET MEDIUM DRAPE 40X70 STRL (DRAPES) ×2 IMPLANT
SLEEVE SCD COMPRESS KNEE MED (MISCELLANEOUS) ×2 IMPLANT
SPLINT FAST PLASTER 5X30 (CAST SUPPLIES) ×20
SPLINT PLASTER CAST FAST 5X30 (CAST SUPPLIES) ×20 IMPLANT
SPONGE LAP 18X18 RF (DISPOSABLE) ×2 IMPLANT
STOCKINETTE 6  STRL (DRAPES) ×2
STOCKINETTE 6 STRL (DRAPES) ×1 IMPLANT
SUCTION FRAZIER HANDLE 10FR (MISCELLANEOUS) ×2
SUCTION TUBE FRAZIER 10FR DISP (MISCELLANEOUS) ×1 IMPLANT
SUT ETHILON 3 0 PS 1 (SUTURE) ×2 IMPLANT
SUT FIBERWIRE #2 38 T-5 BLUE (SUTURE)
SUT MNCRL AB 3-0 PS2 18 (SUTURE) IMPLANT
SUT VIC AB 0 SH 27 (SUTURE) IMPLANT
SUT VIC AB 2-0 SH 27 (SUTURE) ×2
SUT VIC AB 2-0 SH 27XBRD (SUTURE) ×1 IMPLANT
SUTURE FIBERWR #2 38 T-5 BLUE (SUTURE) IMPLANT
SYR BULB EAR ULCER 3OZ GRN STR (SYRINGE) ×2 IMPLANT
SYR CONTROL 10ML LL (SYRINGE) IMPLANT
TOWEL GREEN STERILE FF (TOWEL DISPOSABLE) ×4 IMPLANT
TUBE CONNECTING 20X1/4 (TUBING) ×2 IMPLANT
UNDERPAD 30X36 HEAVY ABSORB (UNDERPADS AND DIAPERS) ×2 IMPLANT

## 2019-10-16 NOTE — Anesthesia Procedure Notes (Signed)
Procedure Name: LMA Insertion Date/Time: 10/16/2019 1:08 PM Performed by: Sheryn Bison, CRNA Pre-anesthesia Checklist: Patient identified, Emergency Drugs available, Suction available and Patient being monitored Patient Re-evaluated:Patient Re-evaluated prior to induction Oxygen Delivery Method: Circle system utilized Preoxygenation: Pre-oxygenation with 100% oxygen Induction Type: IV induction Ventilation: Mask ventilation without difficulty LMA: LMA inserted LMA Size: 4.0 Number of attempts: 1 Airway Equipment and Method: Bite block Placement Confirmation: positive ETCO2 Tube secured with: Tape Dental Injury: Teeth and Oropharynx as per pre-operative assessment

## 2019-10-16 NOTE — Anesthesia Preprocedure Evaluation (Addendum)
Anesthesia Evaluation  Patient identified by MRN, date of birth, ID band Patient awake    Reviewed: Allergy & Precautions, NPO status , Patient's Chart, lab work & pertinent test results  Airway Mallampati: I  TM Distance: >3 FB Neck ROM: Full    Dental no notable dental hx. (+) Teeth Intact, Dental Advisory Given   Pulmonary neg pulmonary ROS, Current Smoker,    Pulmonary exam normal breath sounds clear to auscultation       Cardiovascular hypertension, Pt. on medications negative cardio ROS Normal cardiovascular exam Rhythm:Regular Rate:Normal     Neuro/Psych negative neurological ROS  negative psych ROS   GI/Hepatic negative GI ROS, Neg liver ROS,   Endo/Other  negative endocrine ROS  Renal/GU negative Renal ROS  negative genitourinary   Musculoskeletal negative musculoskeletal ROS (+)   Abdominal   Peds negative pediatric ROS (+)  Hematology negative hematology ROS (+)   Anesthesia Other Findings   Reproductive/Obstetrics negative OB ROS                           Anesthesia Physical Anesthesia Plan  ASA: II  Anesthesia Plan: General   Post-op Pain Management: GA combined w/ Regional for post-op pain   Induction: Intravenous  PONV Risk Score and Plan: 1 and Ondansetron  Airway Management Planned: LMA  Additional Equipment:   Intra-op Plan:   Post-operative Plan: Extubation in OR  Informed Consent: I have reviewed the patients History and Physical, chart, labs and discussed the procedure including the risks, benefits and alternatives for the proposed anesthesia with the patient or authorized representative who has indicated his/her understanding and acceptance.     Dental advisory given  Plan Discussed with: CRNA, Anesthesiologist and Surgeon  Anesthesia Plan Comments: (Discussed both nerve block for pain relief post-op and GA; including NV, sore throat, dental  injury, and pulmonary complications)       Anesthesia Quick Evaluation

## 2019-10-16 NOTE — Progress Notes (Signed)
Assisted Dr. Oddono with right, ultrasound guided, popliteal/saphenous block. Side rails up, monitors on throughout procedure. See vital signs in flow sheet. Tolerated Procedure well. 

## 2019-10-16 NOTE — Anesthesia Procedure Notes (Signed)
Anesthesia Regional Block: Popliteal block   Pre-Anesthetic Checklist: ,, timeout performed, Correct Patient, Correct Site, Correct Laterality, Correct Procedure, Correct Position, site marked, Risks and benefits discussed,  Surgical consent,  Pre-op evaluation,  At surgeon's request and post-op pain management  Laterality: Right  Prep: chloraprep       Needles:  Injection technique: Single-shot  Needle Type: Echogenic Stimulator Needle     Needle Length: 5cm  Needle Gauge: 22     Additional Needles:   Procedures:, nerve stimulator,,, ultrasound used (permanent image in chart),,,,   Nerve Stimulator or Paresthesia:  Response: foot, 0.45 mA,   Additional Responses:   Narrative:  Start time: 10/16/2019 12:40 PM End time: 10/16/2019 12:43 PM Injection made incrementally with aspirations every 5 mL.  Performed by: Personally  Anesthesiologist: Bethena Midget, MD  Additional Notes: Functioning IV was confirmed and monitors were applied.  A 32mm 22ga Arrow echogenic stimulator needle was used. Sterile prep and drape,hand hygiene and sterile gloves were used. Ultrasound guidance: relevant anatomy identified, needle position confirmed, local anesthetic spread visualized around nerve(s)., vascular puncture avoided.  Image printed for medical record. Negative aspiration and negative test dose prior to incremental administration of local anesthetic. The patient tolerated the procedure well.

## 2019-10-16 NOTE — Discharge Instructions (Addendum)
Jeffrey Simmer, MD [facility]  Please read the following information regarding your care after surgery.  Medications  You only need a prescription for the narcotic pain medicine (ex. oxycodone, Percocet, Norco).  All of the other medicines listed below are available over the counter. X Aleve 2 pills twice a day for the first 3 days after surgery. X acetominophen (Tylenol) 650 mg every 4-6 hours as you need for minor to moderate pain X oxycodone as prescribed for severe pain  Narcotic pain medicine (ex. oxycodone, Percocet, Vicodin) will cause constipation.  To prevent this problem, take the following medicines while you are taking any pain medicine. X docusate sodium (Colace) 100 mg twice a day X senna (Senokot) 2 tablets twice a day  X To help prevent blood clots, take a Lovenox injection for two weeks starting the day after surgery.  Then take a baby aspirin (81 mg) twice a day for the following four weeks.  You should also get up every hour while you are awake to move around.    Weight Bearing ? Bear weight when you are able on your operated leg or foot. X Bear weight only on your operated foot in the post-op shoe. ? Do not bear any weight on the operated leg or foot.  Cast / Splint / Dressing X Keep your splint, cast or dressing clean and dry.  Don't put anything (coat hanger, pencil, etc) down inside of it.  If it gets damp, use a hair dryer on the cool setting to dry it.  If it gets soaked, call the office to schedule an appointment for a cast change. ? Remove your dressing 3 days after surgery and cover the incisions with dry dressings.    After your dressing, cast or splint is removed; you may shower, but do not soak or scrub the wound.  Allow the water to run over it, and then gently pat it dry.  Swelling It is normal for you to have swelling where you had surgery.  To reduce swelling and pain, keep your toes above your nose for at least 3 days after surgery.  It may be necessary  to keep your foot or leg elevated for several weeks.  If it hurts, it should be elevated.  Follow Up Call my office at [phone] when you are discharged from the hospital or surgery center to schedule an appointment to be seen two weeks after surgery.  Call my office at [phone] if you develop a fever >101.5 F, nausea, vomiting, bleeding from the surgical site or severe pain.     TYLENOL 650 GIVEN AT 2:53.    Post Anesthesia Home Care Instructions  Activity: Get plenty of rest for the remainder of the day. A responsible individual must stay with you for 24 hours following the procedure.  For the next 24 hours, DO NOT: -Drive a car -Paediatric nurse -Drink alcoholic beverages -Take any medication unless instructed by your physician -Make any legal decisions or sign important papers.  Meals: Start with liquid foods such as gelatin or soup. Progress to regular foods as tolerated. Avoid greasy, spicy, heavy foods. If nausea and/or vomiting occur, drink only clear liquids until the nausea and/or vomiting subsides. Call your physician if vomiting continues.  Special Instructions/Symptoms: Your throat may feel dry or sore from the anesthesia or the breathing tube placed in your throat during surgery. If this causes discomfort, gargle with warm salt water. The discomfort should disappear within 24 hours.  If you had a scopolamine  patch placed behind your ear for the management of post- operative nausea and/or vomiting:  1. The medication in the patch is effective for 72 hours, after which it should be removed.  Wrap patch in a tissue and discard in the trash. Wash hands thoroughly with soap and water. 2. You may remove the patch earlier than 72 hours if you experience unpleasant side effects which may include dry mouth, dizziness or visual disturbances. 3. Avoid touching the patch. Wash your hands with soap and water after contact with the patch.      Regional Anesthesia  Blocks  1. Numbness or the inability to move the "blocked" extremity may last from 3-48 hours after placement. The length of time depends on the medication injected and your individual response to the medication. If the numbness is not going away after 48 hours, call your surgeon.  2. The extremity that is blocked will need to be protected until the numbness is gone and the  Strength has returned. Because you cannot feel it, you will need to take extra care to avoid injury. Because it may be weak, you may have difficulty moving it or using it. You may not know what position it is in without looking at it while the block is in effect.  3. For blocks in the legs and feet, returning to weight bearing and walking needs to be done carefully. You will need to wait until the numbness is entirely gone and the strength has returned. You should be able to move your leg and foot normally before you try and bear weight or walk. You will need someone to be with you when you first try to ensure you do not fall and possibly risk injury.  4. Bruising and tenderness at the needle site are common side effects and will resolve in a few days.  5. Persistent numbness or new problems with movement should be communicated to the surgeon or the [facility] [phone] [facility] [phone]).   Call your surgeon if you experience:   1.  Fever over 101.0. 2.  Inability to urinate. 3.  Nausea and/or vomiting. 4.  Extreme swelling or bruising at the surgical site. 5.  Continued bleeding from the incision. 6.  Increased pain, redness or drainage from the incision. 7.  Problems related to your pain medication. 8.  Any problems and/or concerns

## 2019-10-16 NOTE — Anesthesia Postprocedure Evaluation (Signed)
Anesthesia Post Note  Patient: Jeffrey Woods  Procedure(s) Performed: Open Reduction Internal Fixation  (ORIF) Right Bimalleolar Fracture (Right Ankle)     Anesthesia Type: General    Last Vitals:  Vitals:   10/16/19 1511 10/16/19 1548  BP: 136/84 129/76  Pulse:  71  Resp: 20 20  Temp:  36.6 C  SpO2: 99% 100%    Last Pain:  Vitals:   10/16/19 1548  TempSrc: Oral  PainSc: 0-No pain                 Divina Neale

## 2019-10-16 NOTE — Anesthesia Postprocedure Evaluation (Signed)
Anesthesia Post Note  Patient: RANDOM DOBROWSKI  Procedure(s) Performed: Open Reduction Internal Fixation  (ORIF) Right Bimalleolar Fracture (Right Ankle)     Patient location during evaluation: PACU Anesthesia Type: General Level of consciousness: awake and alert Pain management: pain level controlled Vital Signs Assessment: post-procedure vital signs reviewed and stable Respiratory status: spontaneous breathing, nonlabored ventilation, respiratory function stable and patient connected to nasal cannula oxygen Cardiovascular status: blood pressure returned to baseline and stable Postop Assessment: no apparent nausea or vomiting Anesthetic complications: no    Last Vitals:  Vitals:   10/16/19 1511 10/16/19 1548  BP: 136/84 129/76  Pulse:  71  Resp: 20 20  Temp:  36.6 C  SpO2: 99% 100%    Last Pain:  Vitals:   10/16/19 1548  TempSrc: Oral  PainSc: 0-No pain                 Lakecia Deschamps

## 2019-10-16 NOTE — Anesthesia Procedure Notes (Addendum)
Anesthesia Regional Block: Femoral nerve block (Saphenous)   Pre-Anesthetic Checklist: ,, timeout performed, Correct Patient, Correct Site, Correct Laterality, Correct Procedure, Correct Position, site marked, Risks and benefits discussed,  Surgical consent,  Pre-op evaluation,  At surgeon's request and post-op pain management  Laterality: Right  Prep: chloraprep       Needles:  Injection technique: Single-shot  Needle Type: Echogenic Stimulator Needle     Needle Length: 5cm  Needle Gauge: 22     Additional Needles:   Procedures:, nerve stimulator,,, ultrasound used (permanent image in chart),,,,  Narrative:  Start time: 10/16/2019 12:45 PM End time: 10/16/2019 12:50 PM Injection made incrementally with aspirations every 5 mL.  Performed by: Personally  Anesthesiologist: Bethena Midget, MD  Additional Notes: Functioning IV was confirmed and monitors were applied.  A 36mm 22ga Arrow echogenic stimulator needle was used. Sterile prep and drape,hand hygiene and sterile gloves were used. Ultrasound guidance: relevant anatomy identified, needle position confirmed, local anesthetic spread visualized around nerve(s)., vascular puncture avoided.  Image printed for medical record. Negative aspiration and negative test dose prior to incremental administration of local anesthetic. The patient tolerated the procedure well.
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
## 2019-10-16 NOTE — Transfer of Care (Signed)
Immediate Anesthesia Transfer of Care Note  Patient: Jeffrey Woods  Procedure(s) Performed: Open Reduction Internal Fixation  (ORIF) Right Bimalleolar Fracture (Right Ankle)  Patient Location: PACU  Anesthesia Type:GA combined with regional for post-op pain  Level of Consciousness: drowsy and patient cooperative  Airway & Oxygen Therapy: Patient Spontanous Breathing and Patient connected to face mask oxygen  Post-op Assessment: Report given to RN and Post -op Vital signs reviewed and stable  Post vital signs: Reviewed and stable  Last Vitals:  Vitals Value Taken Time  BP 117/63 10/16/19 1420  Temp    Pulse 63 10/16/19 1421  Resp 18 10/16/19 1421  SpO2 100 % 10/16/19 1421  Vitals shown include unvalidated device data.  Last Pain:  Vitals:   10/16/19 1221  TempSrc: Temporal  PainSc: 5          Complications: No apparent anesthesia complications
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
## 2020-10-13 ENCOUNTER — Other Ambulatory Visit (HOSPITAL_COMMUNITY): Payer: Self-pay | Admitting: Physician Assistant

## 2020-10-13 DIAGNOSIS — M542 Cervicalgia: Secondary | ICD-10-CM

## 2020-11-17 ENCOUNTER — Emergency Department (HOSPITAL_COMMUNITY)
Admission: EM | Admit: 2020-11-17 | Discharge: 2020-11-18 | Disposition: A | Discharge status: Home / Self Care | Payer: Self-pay | Attending: Emergency Medicine | Admitting: Emergency Medicine

## 2020-11-17 ENCOUNTER — Other Ambulatory Visit: Payer: Self-pay

## 2020-11-17 ENCOUNTER — Emergency Department (HOSPITAL_COMMUNITY): Payer: Self-pay

## 2020-11-17 ENCOUNTER — Encounter (HOSPITAL_COMMUNITY): Payer: Self-pay

## 2020-11-17 DIAGNOSIS — F17210 Nicotine dependence, cigarettes, uncomplicated: Secondary | ICD-10-CM | POA: Insufficient documentation

## 2020-11-17 DIAGNOSIS — R1084 Generalized abdominal pain: Secondary | ICD-10-CM

## 2020-11-17 DIAGNOSIS — N179 Acute kidney failure, unspecified: Secondary | ICD-10-CM | POA: Insufficient documentation

## 2020-11-17 DIAGNOSIS — E860 Dehydration: Secondary | ICD-10-CM | POA: Insufficient documentation

## 2020-11-17 LAB — COMPREHENSIVE METABOLIC PANEL
ALT: 47 U/L — ABNORMAL HIGH (ref 0–44)
AST: 24 U/L (ref 15–41)
Albumin: 5.1 g/dL — ABNORMAL HIGH (ref 3.5–5.0)
Alkaline Phosphatase: 92 U/L (ref 38–126)
Anion gap: 11 (ref 5–15)
BUN: 21 mg/dL — ABNORMAL HIGH (ref 6–20)
CO2: 27 mmol/L (ref 22–32)
Calcium: 9.5 mg/dL (ref 8.9–10.3)
Chloride: 97 mmol/L — ABNORMAL LOW (ref 98–111)
Creatinine, Ser: 2.26 mg/dL — ABNORMAL HIGH (ref 0.61–1.24)
GFR, Estimated: 40 mL/min — ABNORMAL LOW (ref >60)
Glucose, Bld: 97 mg/dL (ref 70–99)
Potassium: 3.8 mmol/L (ref 3.5–5.1)
Sodium: 135 mmol/L (ref 135–145)
Total Bilirubin: 0.8 mg/dL (ref 0.3–1.2)
Total Protein: 8.1 g/dL (ref 6.5–8.1)

## 2020-11-17 LAB — CBC WITH DIFFERENTIAL/PLATELET
Abs Immature Granulocytes: 0.03 10*3/uL (ref 0.00–0.07)
Basophils Absolute: 0 10*3/uL (ref 0.0–0.1)
Basophils Relative: 0 %
Eosinophils Absolute: 0.1 10*3/uL (ref 0.0–0.5)
Eosinophils Relative: 1 %
HCT: 47.5 % (ref 39.0–52.0)
Hemoglobin: 16.6 g/dL (ref 13.0–17.0)
Immature Granulocytes: 0 %
Lymphocytes Relative: 18 %
Lymphs Abs: 1.9 10*3/uL (ref 0.7–4.0)
MCH: 31.6 pg (ref 26.0–34.0)
MCHC: 34.9 g/dL (ref 30.0–36.0)
MCV: 90.5 fL (ref 80.0–100.0)
Monocytes Absolute: 0.7 10*3/uL (ref 0.1–1.0)
Monocytes Relative: 6 %
Neutro Abs: 7.7 10*3/uL (ref 1.7–7.7)
Neutrophils Relative %: 75 %
Platelets: 369 10*3/uL (ref 150–400)
RBC: 5.25 MIL/uL (ref 4.22–5.81)
RDW: 12.5 % (ref 11.5–15.5)
WBC: 10.4 10*3/uL (ref 4.0–10.5)
nRBC: 0 % (ref 0.0–0.2)

## 2020-11-17 LAB — LIPASE, BLOOD: Lipase: 25 U/L (ref 11–51)

## 2020-11-17 MED ORDER — DICYCLOMINE HCL 10 MG PO CAPS
10.0000 mg | ORAL_CAPSULE | Freq: Once | ORAL | Status: AC
  Administered 2020-11-17: 10 mg via ORAL
  Filled 2020-11-17: qty 1

## 2020-11-17 MED ORDER — SODIUM CHLORIDE 0.9 % IV BOLUS
1000.0000 mL | Freq: Once | INTRAVENOUS | Status: AC
  Administered 2020-11-17: 1000 mL via INTRAVENOUS

## 2020-11-17 MED ORDER — ACETAMINOPHEN 500 MG PO TABS
1000.0000 mg | ORAL_TABLET | Freq: Once | ORAL | Status: AC
  Administered 2020-11-17: 1000 mg via ORAL
  Filled 2020-11-17: qty 2

## 2020-11-17 NOTE — ED Provider Notes (Signed)
Emergency Medicine Provider Triage Evaluation Note  Jeffrey Woods , a 25 y.o. male  was evaluated in triage.  Pt complains of abd pain that started last night. Also c/o diarrhea. Denies fever.  Review of Systems  Positive: abd pain, diarrhea Negative: fever  Physical Exam  BP 118/69 (BP Location: Right Arm)   Pulse 85   Temp 98.8 F (37.1 C) (Oral)   Resp 20   Ht 6' (1.829 m)   Wt 104.3 kg   SpO2 98%   BMI 31.19 kg/m  Gen:   Awake, no distress   Resp:  Normal effort  MSK:   Moves extremities without difficulty  Other:  Periumbilical abd ttp  Medical Decision Making  Medically screening exam initiated at 6:35 PM.  Appropriate orders placed.  Jeffrey Woods was informed that the remainder of the evaluation will be completed by another provider, this initial triage assessment does not replace that evaluation, and the importance of remaining in the ED until their evaluation is complete.     Jeffrey Woods 11/17/20 1835    Jeffrey Mulders, MD 11/18/20 [phone]

## 2020-11-17 NOTE — ED Triage Notes (Signed)
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  States that it is a constant pain.

## 2020-11-18 LAB — BASIC METABOLIC PANEL
Anion gap: 5 (ref 5–15)
BUN: 20 mg/dL (ref 6–20)
CO2: 28 mmol/L (ref 22–32)
Calcium: 8.2 mg/dL — ABNORMAL LOW (ref 8.9–10.3)
Chloride: 103 mmol/L (ref 98–111)
Creatinine, Ser: 1.56 mg/dL — ABNORMAL HIGH (ref 0.61–1.24)
GFR, Estimated: >60 mL/min (ref >60)
Glucose, Bld: 104 mg/dL — ABNORMAL HIGH (ref 70–99)
Potassium: 4.2 mmol/L (ref 3.5–5.1)
Sodium: 136 mmol/L (ref 135–145)

## 2020-11-18 LAB — URINALYSIS, ROUTINE W REFLEX MICROSCOPIC
Bilirubin Urine: NEGATIVE
Glucose, UA: NEGATIVE mg/dL
Hgb urine dipstick: NEGATIVE
Ketones, ur: NEGATIVE mg/dL
Leukocytes,Ua: NEGATIVE
Nitrite: NEGATIVE
Protein, ur: NEGATIVE mg/dL
Specific Gravity, Urine: 1.017 (ref 1.005–1.030)
pH: 5 (ref 5.0–8.0)

## 2020-11-18 MED ORDER — DICYCLOMINE HCL 20 MG PO TABS: 20.0000 mg | ORAL_TABLET | Freq: Two times a day (BID) | ORAL | 0 refills | Status: DC

## 2020-11-18 MED ORDER — ONDANSETRON 4 MG PO TBDP: 4.0000 mg | ORAL_TABLET | Freq: Three times a day (TID) | ORAL | 0 refills | Status: AC | PRN

## 2020-11-18 NOTE — Discharge Instructions (Signed)
You were evaluated in the Emergency Department and after careful evaluation, we did not find any emergent condition requiring admission or further testing in the hospital.  Your exam/testing today was overall reassuring.  Symptoms seem to be due to dehydration causing a mild kidney injury.  Recommend continued hydration at home, drinking plenty of fluids.  Can use the Zofran as needed for nausea at home.  Can use the Bentyl as needed for crampy abdominal pain at home.  Please return to the Emergency Department if you experience any worsening of your condition.  Thank you for allowing [place] to be a part of your care.
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
## 2022-10-03 ENCOUNTER — Other Ambulatory Visit: Payer: Self-pay

## 2022-10-03 ENCOUNTER — Emergency Department (HOSPITAL_COMMUNITY)
Admission: EM | Admit: 2022-10-03 | Discharge: 2022-10-03 | Disposition: A | Discharge status: Home / Self Care | Payer: Self-pay | Attending: Emergency Medicine | Admitting: Emergency Medicine

## 2022-10-03 ENCOUNTER — Encounter (HOSPITAL_COMMUNITY): Payer: Self-pay | Admitting: Emergency Medicine

## 2022-10-03 DIAGNOSIS — K029 Dental caries, unspecified: Secondary | ICD-10-CM | POA: Insufficient documentation

## 2022-10-03 DIAGNOSIS — K529 Noninfective gastroenteritis and colitis, unspecified: Secondary | ICD-10-CM | POA: Insufficient documentation

## 2022-10-03 DIAGNOSIS — Z20822 Contact with and (suspected) exposure to covid-19: Secondary | ICD-10-CM | POA: Insufficient documentation

## 2022-10-03 LAB — RESP PANEL BY RT-PCR (RSV, FLU A&B, COVID)  RVPGX2
Influenza A by PCR: NEGATIVE
Influenza B by PCR: NEGATIVE
Resp Syncytial Virus by PCR: NEGATIVE
SARS Coronavirus 2 by RT PCR: NEGATIVE

## 2022-10-03 LAB — URINALYSIS, ROUTINE W REFLEX MICROSCOPIC
Bacteria, UA: NONE SEEN
Bilirubin Urine: NEGATIVE
Glucose, UA: NEGATIVE mg/dL
Hgb urine dipstick: NEGATIVE
Ketones, ur: NEGATIVE mg/dL
Leukocytes,Ua: NEGATIVE
Nitrite: NEGATIVE
Protein, ur: 30 mg/dL — AB
Specific Gravity, Urine: 1.03 (ref 1.005–1.030)
pH: 5 (ref 5.0–8.0)

## 2022-10-03 LAB — COMPREHENSIVE METABOLIC PANEL
ALT: 79 U/L — ABNORMAL HIGH (ref 0–44)
AST: 32 U/L (ref 15–41)
Albumin: 4.8 g/dL (ref 3.5–5.0)
Alkaline Phosphatase: 66 U/L (ref 38–126)
Anion gap: 7 (ref 5–15)
BUN: 15 mg/dL (ref 6–20)
CO2: 27 mmol/L (ref 22–32)
Calcium: 9 mg/dL (ref 8.9–10.3)
Chloride: 100 mmol/L (ref 98–111)
Creatinine, Ser: 0.93 mg/dL (ref 0.61–1.24)
GFR, Estimated: >60 mL/min (ref >60)
Glucose, Bld: 94 mg/dL (ref 70–99)
Potassium: 3.6 mmol/L (ref 3.5–5.1)
Sodium: 134 mmol/L — ABNORMAL LOW (ref 135–145)
Total Bilirubin: 1.3 mg/dL — ABNORMAL HIGH (ref 0.3–1.2)
Total Protein: 8 g/dL (ref 6.5–8.1)

## 2022-10-03 LAB — CBC
HCT: 47.2 % (ref 39.0–52.0)
Hemoglobin: 17.1 g/dL — ABNORMAL HIGH (ref 13.0–17.0)
MCH: 32.1 pg (ref 26.0–34.0)
MCHC: 36.2 g/dL — ABNORMAL HIGH (ref 30.0–36.0)
MCV: 88.6 fL (ref 80.0–100.0)
Platelets: 334 10*3/uL (ref 150–400)
RBC: 5.33 MIL/uL (ref 4.22–5.81)
RDW: 12.2 % (ref 11.5–15.5)
WBC: 6.9 10*3/uL (ref 4.0–10.5)
nRBC: 0 % (ref 0.0–0.2)

## 2022-10-03 LAB — LIPASE, BLOOD: Lipase: 28 U/L (ref 11–51)

## 2022-10-03 MED ORDER — ACETAMINOPHEN 500 MG PO TABS
1000.0000 mg | ORAL_TABLET | Freq: Once | ORAL | Status: AC
  Administered 2022-10-03: 1000 mg via ORAL
  Filled 2022-10-03: qty 2

## 2022-10-03 MED ORDER — ONDANSETRON HCL 4 MG PO TABS: 4.0000 mg | ORAL_TABLET | Freq: Four times a day (QID) | ORAL | 0 refills | Status: DC

## 2022-10-03 MED ORDER — SODIUM CHLORIDE 0.9 % IV BOLUS
1000.0000 mL | Freq: Once | INTRAVENOUS | Status: AC
  Administered 2022-10-03: 1000 mL via INTRAVENOUS

## 2022-10-03 MED ORDER — ONDANSETRON HCL 4 MG/2ML IJ SOLN
4.0000 mg | Freq: Once | INTRAMUSCULAR | Status: AC
  Administered 2022-10-03: 4 mg via INTRAVENOUS
  Filled 2022-10-03: qty 2

## 2022-10-03 NOTE — ED Triage Notes (Signed)
Vomiting and diarrhea and lost of taste since yesterday. Unable to keep anything down. Pt states he feels dehydrated.

## 2022-10-03 NOTE — Discharge Instructions (Addendum)
Please pick up the Zofran I prescribed for you for your nausea and you may take Imodium over-the-counter for your diarrhea.  Please continue taking plenty of fluids as you want to avoid dehydration.  I have also attached a dentist for you to follow-up with as well.  As we discussed at this time there does not appear to be an indication for antibiotics for your symptoms.  A work note will be given as well for 1 week.  You may alternate every 6 hours as needed for pain between Tylenol and ibuprofen.  If symptoms worsen please return to ER.

## 2022-10-03 NOTE — ED Notes (Signed)
Pt given water for PO challenge and is tolerating well.

## 2022-10-03 NOTE — ED Provider Notes (Signed)
[place] EMERGENCY DEPARTMENT AT [facility] Provider Note   CSN: [id number] Arrival date & time: 10/03/22  1316     History  Chief Complaint  Patient presents with   Emesis    Jeffrey Woods is a 27 y.o. male with 1 day of nausea/vomiting/diarrhea.  Patient states he has not had any recent travel or any new foods that tasted different.  Patient is unsure if he is had fevers but denies any hematemesis or hematochezia or being on any blood thinners or passing out.  Patient has been unable to take in food or fluids without having episodes of emesis.  Patient is concerned that he is dehydrated.  Patient had chest pain, shortness of breath no change in sensation is motor skills, dysuria  Home Medications Prior to Admission medications   Medication Sig Start Date End Date Taking? Authorizing Provider  ondansetron (ZOFRAN) 4 MG tablet Take 1 tablet (4 mg total) by mouth every 6 (six) hours. 10/03/22  Yes Taniyah Ballow, Beverly Gust, PA-C  dicyclomine (BENTYL) 20 MG tablet Take 1 tablet (20 mg total) by mouth 2 (two) times daily. 11/18/20   Sabas Sous, MD  enoxaparin (LOVENOX) 40 MG/0.4ML injection Inject 0.4 mLs (40 mg total) into the skin daily for 14 days. 10/17/19 10/31/19  Toni Arthurs, MD  naproxen sodium (ALEVE) 220 MG tablet Take 2 tablets (440 mg total) by mouth 2 (two) times daily with a meal. For 3-5 days after surgery. 10/16/19   Toni Arthurs, MD  olmesartan (BENICAR) 40 MG tablet Take 40 mg by mouth daily.    [provider]  ondansetron (ZOFRAN ODT) 4 MG disintegrating tablet Take 1 tablet (4 mg total) by mouth every 8 (eight) hours as needed for nausea or vomiting. 11/18/20   Sabas Sous, MD  verapamil (CALAN-SR) 180 MG CR tablet Take 1 tablet (180 mg total) by mouth at bedtime. Patient not taking: Reported on 10/05/2019 05/26/19   Lorelee New, PA-C      Allergies    Cocos nucifera    Review of Systems   Review of Systems  Gastrointestinal:  Positive for  vomiting.  See HPI  Physical Exam Updated Vital Signs BP (!) 132/99   Pulse 80   Temp 99.2 F (37.3 C) (Oral)   Resp 18   Ht 6' (1.829 m)   Wt 105.2 kg   SpO2 100%   BMI 31.46 kg/m  Physical Exam Vitals reviewed.  Constitutional:      General: He is not in acute distress. HENT:     Head: Normocephalic and atraumatic.     Mouth/Throat:     Comments: Cavity noted on molar on left side bottom jaw however no signs of erythema, purulence, abscess, gangrene, tenderness to palpation, edema, floor of oral cavity is not elevated Eyes:     Extraocular Movements: Extraocular movements intact.     Conjunctiva/sclera: Conjunctivae normal.     Pupils: Pupils are equal, round, and reactive to light.  Cardiovascular:     Rate and Rhythm: Normal rate and regular rhythm.     Pulses: Normal pulses.     Heart sounds: Normal heart sounds.     Comments: 2+ bilateral radial/dorsalis pedis pulses with regular rate Pulmonary:     Effort: Pulmonary effort is normal. No respiratory distress.     Breath sounds: Normal breath sounds.  Abdominal:     Palpations: Abdomen is soft.     Tenderness: There is no abdominal tenderness. There is  no guarding or rebound.  Musculoskeletal:        General: Normal range of motion.     Cervical back: Normal range of motion and neck supple.     Comments: 5 out of 5 bilateral grip/leg extension strength  Skin:    General: Skin is warm and dry.     Capillary Refill: Capillary refill takes less than 2 seconds.  Neurological:     General: No focal deficit present.     Mental Status: He is alert and oriented to person, place, and time.     Comments: Sensation intact in all 4 limbs  Psychiatric:        Mood and Affect: Mood normal.     ED Results / Procedures / Treatments   Labs (all labs ordered are listed, but only abnormal results are displayed) Labs Reviewed  COMPREHENSIVE METABOLIC PANEL - Abnormal; Notable for the following components:      Result Value    Sodium 134 (*)    ALT 79 (*)    Total Bilirubin 1.3 (*)    All other components within normal limits  CBC - Abnormal; Notable for the following components:   Hemoglobin 17.1 (*)    MCHC 36.2 (*)    All other components within normal limits  URINALYSIS, ROUTINE W REFLEX MICROSCOPIC - Abnormal; Notable for the following components:   APPearance HAZY (*)    Protein, ur 30 (*)    All other components within normal limits  RESP PANEL BY RT-PCR (RSV, FLU A&B, COVID)  [id number]  LIPASE, BLOOD    EKG None  Radiology No results found.  Procedures Procedures    Medications Ordered in ED Medications  acetaminophen (TYLENOL) tablet 1,000 mg (has no administration in time range)  ondansetron (ZOFRAN) injection 4 mg (4 mg Intravenous Given 10/03/22 1736)  sodium chloride 0.9 % bolus 1,000 mL (0 mLs Intravenous Stopped 10/03/22 1829)    ED Course/ Medical Decision Making/ A&P                             Medical Decision Making Amount and/or Complexity of Data Reviewed Labs: ordered.  Risk OTC drugs. Prescription drug management.   Danford Bad 27 y.o. presented today for URI like symptoms. Working DDx that I considered at this time includes, but not limited to, viral illness, peritonsillar abscess, retropharyngeal abscess, pneumonia, meningitis, apical abscess, dental carry, Ludwig's angina.  R/o DDx: PTA, RPA, pneumonia, meningitis, apical abscess, Ludwig's angina: Patient's history and physical exam are inconsistent with these diagnoses  Review of prior external notes: 10/16/2019 admission  Unique Tests and My Interpretation:  Respiratory Panel: Negative CBC: Unremarkable Lipase: Unremarkable UA: Unremarkable CMP: Unremarkable  Discussion with Independent Historian:  Wife  Discussion of Management of Tests: None  Risk: Medium: prescription drug management  Risk Stratification Score: none  Plan: Patient presented for nausea/vomiting/diarrhea.  On exam patient  was in no acute distress and had stable vitals.  Patient had received 1 L of fluids along with Zofran before my arrival and stated the symptoms had drastically improved.  Patient had unremarkable physical exam and will be discharged with Zofran for symptom management and encouraged patient to follow-up with his primary care provider and that he may use Imodium over-the-counter for his diarrhea.  I spoke to the patient with the importance of taking in plenty of fluids as he is at risk for dehydration.  While leaving the room patient stated  that he was also concerned about a mouth infection.  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  On exam I did not note any edema, purulence, abscess, edema, elevation of oral floor, tenderness to palpation of the tooth but did note a cavity in his second molar.  At this time I do not suspect patient needs antibiotics for this cavity as there are no signs of infection and I verbalized this to the patient and patient verbalized understanding.  I spoke to the patient about how he may alternate every 6 hours needed for pain between Tylenol and ibuprofen.  Patient was given return precautions.patient stable for discharge at this time.  Patient verbalized understanding of plan.  After patient was discharged I realized he did not have an attachment to call dentist for his mouth concerns.  After verifying patient's name patient was called on the work phone and verbally given Dr. Mart Piggs practice encouraged to follow-up with him regarding his cavity.         Final Clinical Impression(s) / ED Diagnoses Final diagnoses:  Gastroenteritis    Rx / DC Orders ED Discharge Orders          Ordered    ondansetron (ZOFRAN) 4 MG tablet  Every 6 hours        10/03/22 1830              Remi Deter 10/03/22 Kerrin Champagne, MD 10/04/22 1137

## 2022-10-24 ENCOUNTER — Ambulatory Visit: Payer: Self-pay | Admitting: Family Medicine

## 2023-11-22 DIAGNOSIS — S50812A Abrasion of left forearm, initial encounter: Secondary | ICD-10-CM | POA: Diagnosis not present

## 2023-12-05 ENCOUNTER — Encounter (HOSPITAL_COMMUNITY): Payer: Self-pay

## 2023-12-05 ENCOUNTER — Emergency Department (HOSPITAL_COMMUNITY)

## 2023-12-05 ENCOUNTER — Other Ambulatory Visit: Payer: Self-pay

## 2023-12-05 ENCOUNTER — Emergency Department (HOSPITAL_COMMUNITY): Admission: EM | Admit: 2023-12-05 | Discharge: 2023-12-05 | Disposition: A | Discharge status: Home / Self Care

## 2023-12-05 DIAGNOSIS — R197 Diarrhea, unspecified: Secondary | ICD-10-CM | POA: Insufficient documentation

## 2023-12-05 DIAGNOSIS — R1030 Lower abdominal pain, unspecified: Secondary | ICD-10-CM | POA: Diagnosis not present

## 2023-12-05 DIAGNOSIS — R1032 Left lower quadrant pain: Secondary | ICD-10-CM | POA: Diagnosis not present

## 2023-12-05 DIAGNOSIS — K828 Other specified diseases of gallbladder: Secondary | ICD-10-CM | POA: Diagnosis not present

## 2023-12-05 DIAGNOSIS — R1031 Right lower quadrant pain: Secondary | ICD-10-CM | POA: Diagnosis not present

## 2023-12-05 DIAGNOSIS — A09 Infectious gastroenteritis and colitis, unspecified: Secondary | ICD-10-CM | POA: Diagnosis not present

## 2023-12-05 DIAGNOSIS — K429 Umbilical hernia without obstruction or gangrene: Secondary | ICD-10-CM | POA: Diagnosis not present

## 2023-12-05 HISTORY — DX: Disorder of kidney and ureter, unspecified: N28.9

## 2023-12-05 LAB — CBC WITH DIFFERENTIAL/PLATELET
Abs Immature Granulocytes: 0.02 10*3/uL (ref 0.00–0.07)
Basophils Absolute: 0 10*3/uL (ref 0.0–0.1)
Basophils Relative: 0 %
Eosinophils Absolute: 0 10*3/uL (ref 0.0–0.5)
Eosinophils Relative: 0 %
HCT: 45.6 % (ref 39.0–52.0)
Hemoglobin: 17 g/dL (ref 13.0–17.0)
Immature Granulocytes: 0 %
Lymphocytes Relative: 20 %
Lymphs Abs: 1.3 10*3/uL (ref 0.7–4.0)
MCH: 31.8 pg (ref 26.0–34.0)
MCHC: 37.3 g/dL — ABNORMAL HIGH (ref 30.0–36.0)
MCV: 85.2 fL (ref 80.0–100.0)
Monocytes Absolute: 0.4 10*3/uL (ref 0.1–1.0)
Monocytes Relative: 6 %
Neutro Abs: 4.9 10*3/uL (ref 1.7–7.7)
Neutrophils Relative %: 74 %
Platelets: 352 10*3/uL (ref 150–400)
RBC: 5.35 MIL/uL (ref 4.22–5.81)
RDW: 12.7 % (ref 11.5–15.5)
WBC: 6.7 10*3/uL (ref 4.0–10.5)
nRBC: 0 % (ref 0.0–0.2)

## 2023-12-05 LAB — MAGNESIUM: Magnesium: 2.1 mg/dL (ref 1.7–2.4)

## 2023-12-05 LAB — URINALYSIS, ROUTINE W REFLEX MICROSCOPIC
Bacteria, UA: NONE SEEN
Bilirubin Urine: NEGATIVE
Glucose, UA: NEGATIVE mg/dL
Ketones, ur: NEGATIVE mg/dL
Leukocytes,Ua: NEGATIVE
Nitrite: NEGATIVE
Protein, ur: 30 mg/dL — AB
Specific Gravity, Urine: 1.025 (ref 1.005–1.030)
pH: 5 (ref 5.0–8.0)

## 2023-12-05 LAB — LIPASE, BLOOD: Lipase: 26 U/L (ref 11–51)

## 2023-12-05 LAB — COMPREHENSIVE METABOLIC PANEL WITH GFR
ALT: 58 U/L — ABNORMAL HIGH (ref 0–44)
AST: 27 U/L (ref 15–41)
Albumin: 5 g/dL (ref 3.5–5.0)
Alkaline Phosphatase: 73 U/L (ref 38–126)
Anion gap: 13 (ref 5–15)
BUN: 13 mg/dL (ref 6–20)
CO2: 20 mmol/L — ABNORMAL LOW (ref 22–32)
Calcium: 9.8 mg/dL (ref 8.9–10.3)
Chloride: 105 mmol/L (ref 98–111)
Creatinine, Ser: 0.85 mg/dL (ref 0.61–1.24)
GFR, Estimated: >60 mL/min (ref >60)
Glucose, Bld: 136 mg/dL — ABNORMAL HIGH (ref 70–99)
Potassium: 4 mmol/L (ref 3.5–5.1)
Sodium: 138 mmol/L (ref 135–145)
Total Bilirubin: 1 mg/dL (ref 0.0–1.2)
Total Protein: 8.5 g/dL — ABNORMAL HIGH (ref 6.5–8.1)

## 2023-12-05 MED ORDER — SODIUM CHLORIDE 0.9 % IV BOLUS
1000.0000 mL | Freq: Once | INTRAVENOUS | Status: AC
  Administered 2023-12-05: 1000 mL via INTRAVENOUS

## 2023-12-05 MED ORDER — ONDANSETRON HCL 4 MG PO TABS: 4.0000 mg | ORAL_TABLET | Freq: Four times a day (QID) | ORAL | 0 refills | Status: DC

## 2023-12-05 MED ORDER — ONDANSETRON HCL 4 MG/2ML IJ SOLN
4.0000 mg | Freq: Once | INTRAMUSCULAR | Status: AC
  Administered 2023-12-05: 4 mg via INTRAVENOUS
  Filled 2023-12-05: qty 2

## 2023-12-05 MED ORDER — MORPHINE SULFATE (PF) 2 MG/ML IV SOLN
2.0000 mg | Freq: Once | INTRAVENOUS | Status: AC
  Administered 2023-12-05: 2 mg via INTRAVENOUS
  Filled 2023-12-05: qty 1

## 2023-12-05 MED ORDER — MORPHINE SULFATE (PF) 4 MG/ML IV SOLN
4.0000 mg | Freq: Once | INTRAVENOUS | Status: AC
  Administered 2023-12-05: 4 mg via INTRAVENOUS
  Filled 2023-12-05: qty 1

## 2023-12-05 MED ORDER — IOHEXOL 300 MG/ML  SOLN
100.0000 mL | Freq: Once | INTRAMUSCULAR | Status: AC | PRN
  Administered 2023-12-05: 100 mL via INTRAVENOUS

## 2023-12-05 NOTE — Discharge Instructions (Signed)
 We discussed, the CT imaging showed mild sludge in the gallbladder but given your lab values as well as your location of pain I do not think this is associated with your pain.  This is more likely related to a gastroenteritis versus colitis.  Please give this some time to see if you improved.  If you start having any kind of fevers especially with worsening abdominal pain please come to ED immediately.  Otherwise, please follow-up with your primary care physician.

## 2023-12-05 NOTE — ED Triage Notes (Signed)
 Pt arrived via generalized abdominal pain that began yesterday. Pt reports having diarrhea and emesis. Pt concerned it may be a kidney problem or gallbladder problem.

## 2023-12-05 NOTE — ED Provider Notes (Signed)
 [place] EMERGENCY DEPARTMENT AT [facility] Provider Note   CSN: [id number] Arrival date & time: 12/05/23  4098     Patient presents with: Abdominal Pain   Jeffrey Woods is a 28 y.o. male.    Abdominal Pain Associated symptoms: no chest pain, no chills, no cough, no dysuria, no fever, no hematuria, no shortness of breath, no sore throat and no vomiting    Patient presents due to abdominal pain.  Right lower quadrant as well as left lower quad abdominal pain and some radiation into his lower back over the past day.  Patient states that he woke up with the pain yesterday.  Has since been having episodes of diarrhea.  No blood in his stool that he can appreciate.  1 episode of emesis.  He has had decreased p.o. intake over the past day because of this.  Patient states he does have a history of appendectomy.  No previous abdominal surgeries other than appendectomy.  No history of nephrolithiasis.  Denies hematuria.  Denies any chest pain or shortness of breath.  No fever or chills.   Chart reviewed.  Patient was last seen in the ED in April 2024.  Present because emesis at that time.  Had a negative workup at that time. s     Prior to Admission medications   Medication Sig Start Date End Date Taking? Authorizing Provider  ondansetron  (ZOFRAN ) 4 MG tablet Take 1 tablet (4 mg total) by mouth every 6 (six) hours. 12/05/23  Yes Spero Dye, MD  dicyclomine  (BENTYL ) 20 MG tablet Take 1 tablet (20 mg total) by mouth 2 (two) times daily. 11/18/20   Bero, Michael M, MD  enoxaparin  (LOVENOX ) 40 MG/0.4ML injection Inject 0.4 mLs (40 mg total) into the skin daily for 14 days. 10/17/19 10/31/19  Amada Backer, MD  naproxen  sodium (ALEVE ) 220 MG tablet Take 2 tablets (440 mg total) by mouth 2 (two) times daily with a meal. For 3-5 days after surgery. 10/16/19   Amada Backer, MD  olmesartan (BENICAR) 40 MG tablet Take 40 mg by mouth daily.    [provider]  ondansetron  (ZOFRAN   ODT) 4 MG disintegrating tablet Take 1 tablet (4 mg total) by mouth every 8 (eight) hours as needed for nausea or vomiting. 11/18/20   Bero, Michael M, MD  verapamil  (CALAN -SR) 180 MG CR tablet Take 1 tablet (180 mg total) by mouth at bedtime. Patient not taking: Reported on 10/05/2019 05/26/19   Jhonnie Mosher, PA-C    Allergies: Barbie Boon    Review of Systems  Constitutional:  Negative for chills and fever.  HENT:  Negative for ear pain and sore throat.   Eyes:  Negative for pain and visual disturbance.  Respiratory:  Negative for cough and shortness of breath.   Cardiovascular:  Negative for chest pain and palpitations.  Gastrointestinal:  Positive for abdominal pain. Negative for vomiting.  Genitourinary:  Negative for dysuria and hematuria.  Musculoskeletal:  Negative for arthralgias and back pain.  Skin:  Negative for color change and rash.  Neurological:  Negative for seizures and syncope.  All other systems reviewed and are negative.   Updated Vital Signs BP (!) 135/96   Pulse 64   Temp 98.1 F (36.7 C) (Oral)   Resp 18   Ht 6' (1.829 m)   Wt 108 kg   SpO2 99%   BMI 32.28 kg/m   Physical Exam Vitals and nursing note reviewed.  Constitutional:  General: He is not in acute distress.    Appearance: He is well-developed.  HENT:     Head: Normocephalic and atraumatic.   Eyes:     Conjunctiva/sclera: Conjunctivae normal.    Cardiovascular:     Rate and Rhythm: Normal rate and regular rhythm.     Heart sounds: No murmur heard. Pulmonary:     Effort: Pulmonary effort is normal. No respiratory distress.     Breath sounds: Normal breath sounds.  Abdominal:     Palpations: Abdomen is soft.     Tenderness: There is abdominal tenderness.   Musculoskeletal:        General: No swelling.     Cervical back: Neck supple.   Skin:    General: Skin is warm and dry.     Capillary Refill: Capillary refill takes less than 2 seconds.   Neurological:     Mental  Status: He is alert.   Psychiatric:        Mood and Affect: Mood normal.     (all labs ordered are listed, but only abnormal results are displayed) Labs Reviewed  COMPREHENSIVE METABOLIC PANEL WITH GFR - Abnormal; Notable for the following components:      Result Value   CO2 20 (*)    Glucose, Bld 136 (*)    Total Protein 8.5 (*)    ALT 58 (*)    All other components within normal limits  URINALYSIS, ROUTINE W REFLEX MICROSCOPIC - Abnormal; Notable for the following components:   APPearance CLOUDY (*)    Hgb urine dipstick SMALL (*)    Protein, ur 30 (*)    All other components within normal limits  CBC WITH DIFFERENTIAL/PLATELET - Abnormal; Notable for the following components:   MCHC 37.3 (*)    All other components within normal limits  LIPASE, BLOOD  MAGNESIUM    EKG: None  Radiology: CT ABDOMEN PELVIS W CONTRAST Result Date: 12/05/2023 CLINICAL DATA:  Lower abdominal pain. Right flank pain. History of appendectomy. EXAM: CT ABDOMEN AND PELVIS WITH CONTRAST TECHNIQUE: Multidetector CT imaging of the abdomen and pelvis was performed using the standard protocol following bolus administration of intravenous contrast. RADIATION DOSE REDUCTION: This exam was performed according to the departmental dose-optimization program which includes automated exposure control, adjustment of the mA and/or kV according to patient size and/or use of iterative reconstruction technique. CONTRAST:  100mL OMNIPAQUE IOHEXOL 300 MG/ML  SOLN COMPARISON:  CT abdomen/pelvis dated 11/17/2020. FINDINGS: Lower chest: No acute abnormality. Hepatobiliary: No focal liver abnormality is seen. Subtle layering hyperdensity within the mildly distended gallbladder could reflect sludge. No gallstones, gallbladder wall thickening, or biliary dilatation. Pancreas: Unremarkable. No pancreatic ductal dilatation or surrounding inflammatory changes. Spleen: Normal in size without focal abnormality. Adrenals/Urinary Tract:  Adrenal glands are unremarkable. Kidneys are normal, without renal calculi, focal lesion, or hydronephrosis. Bladder is minimally distended and otherwise grossly unremarkable. Stomach/Bowel: Stomach is within normal limits. Appendix is surgically absent. No evidence of bowel wall thickening, distention, or inflammatory changes. Vascular/Lymphatic: No significant vascular findings are present. No enlarged abdominal or pelvic lymph nodes. Reproductive: Prostate is unremarkable. Other: Small fat containing umbilical hernia. No abdominopelvic ascites. No free air. Musculoskeletal: No acute or significant osseous findings. IMPRESSION: 1. No acute localizing findings in the abdomen or pelvis. 2. Subtle layering hyperdensity in a mildly distended gallbladder could reflect sludge. No gallstones. No evidence of acute cholecystitis. 3. Status post appendectomy. Electronically Signed   By: Mannie Seek M.D.   On: 12/05/2023 12:45  Procedures   Medications Ordered in the ED  morphine (PF) 4 MG/ML injection 4 mg (4 mg Intravenous Given 12/05/23 0956)  sodium chloride  0.9 % bolus 1,000 mL (0 mLs Intravenous Stopped 12/05/23 1214)  ondansetron  (ZOFRAN ) injection 4 mg (4 mg Intravenous Given 12/05/23 0955)  sodium chloride  0.9 % bolus 1,000 mL (0 mLs Intravenous Stopped 12/05/23 1214)  iohexol (OMNIPAQUE) 300 MG/ML solution 100 mL (100 mLs Intravenous Contrast Given 12/05/23 1020)  morphine (PF) 2 MG/ML injection 2 mg (2 mg Intravenous Given 12/05/23 1138)                                    Medical Decision Making Amount and/or Complexity of Data Reviewed Labs: ordered. Radiology: ordered.  Risk Prescription drug management.      Patient presents due to abdominal pain.  Right lower quadrant as well as left lower quad abdominal pain and some radiation into his lower back over the past day.  Patient states that he woke up with the pain yesterday.  Has since been having episodes of diarrhea.  No blood in  his stool that he can appreciate.  1 episode of emesis.  He has had decreased p.o. intake over the past day because of this.  Patient states he does have a history of appendectomy.  No previous abdominal surgeries other than appendectomy.  No history of nephrolithiasis.  Denies hematuria.  Denies any chest pain or shortness of breath.  No fever or chills.    Chart reviewed.  Patient was last seen in the ED in April 2024.  Present because emesis at that time.  Had a negative workup at that time. s  Upon exam, patient hemodynamically stable.  Afebrile.  Pulse and maps appropriate.    did have some pain to palpation in the lower quadrant.  Specifically right lower quadrant as well as CVA tenderness.  GU exam completed by me.  No testicular swelling.  No evidence of torsion.  + Cremaster reflex bilaterally.  \ [place] workup showed mild acidosis.  Likely because of GI losses.  Patient received a liter of fluid here in the ED.  Otherwise, no AKI.  Electrolytes normal.  T. bili normal.  ALT minimally elevated which has been seen in the past.  Did obtain CT scan to rule out diverticulitis or other pathology in the left lower quadrant given the location of pain.  CT scan did not show any other pathology in the left lower quadrant.  Did note some slight sludge in the gallbladder but otherwise given patient's normal laboratory values has no pain in the upper quadrant with negative Murphy sign, I think is highly unlikely to be associate with any cholecystitis.  Rather given this normal laboratory workup as well as imaging, I think this could be more of a gastroenteritis versus colitis.  Presumed infectious.  Recommended supportive care.  Patient follow-up with PCP.  Instructed patient to come back to the ED immediately if he starts experience any kind of fever or chills with worsening abdominal pain or any Dark stools.     Final diagnoses:  Diarrhea of presumed infectious origin  Lower abdominal pain    ED  Discharge Orders          Ordered    ondansetron  (ZOFRAN ) 4 MG tablet  Every 6 hours        12/05/23 1305  Spero Dye, MD 12/05/23 1311

## 2023-12-08 ENCOUNTER — Other Ambulatory Visit: Payer: Self-pay

## 2023-12-08 ENCOUNTER — Emergency Department (HOSPITAL_COMMUNITY)
Admission: EM | Admit: 2023-12-08 | Discharge: 2023-12-09 | Disposition: A | Discharge status: Home / Self Care | Attending: Emergency Medicine | Admitting: Emergency Medicine

## 2023-12-08 DIAGNOSIS — R110 Nausea: Secondary | ICD-10-CM | POA: Diagnosis not present

## 2023-12-08 DIAGNOSIS — I10 Essential (primary) hypertension: Secondary | ICD-10-CM | POA: Insufficient documentation

## 2023-12-08 DIAGNOSIS — R197 Diarrhea, unspecified: Secondary | ICD-10-CM | POA: Insufficient documentation

## 2023-12-08 DIAGNOSIS — R1084 Generalized abdominal pain: Secondary | ICD-10-CM | POA: Insufficient documentation

## 2023-12-08 DIAGNOSIS — K828 Other specified diseases of gallbladder: Secondary | ICD-10-CM | POA: Diagnosis not present

## 2023-12-08 DIAGNOSIS — Z79899 Other long term (current) drug therapy: Secondary | ICD-10-CM | POA: Insufficient documentation

## 2023-12-08 DIAGNOSIS — R109 Unspecified abdominal pain: Secondary | ICD-10-CM | POA: Diagnosis not present

## 2023-12-08 DIAGNOSIS — E876 Hypokalemia: Secondary | ICD-10-CM | POA: Diagnosis not present

## 2023-12-08 DIAGNOSIS — R1011 Right upper quadrant pain: Secondary | ICD-10-CM | POA: Diagnosis not present

## 2023-12-08 LAB — COMPREHENSIVE METABOLIC PANEL WITH GFR
ALT: 36 U/L (ref 0–44)
AST: 19 U/L (ref 15–41)
Albumin: 4.2 g/dL (ref 3.5–5.0)
Alkaline Phosphatase: 63 U/L (ref 38–126)
Anion gap: 10 (ref 5–15)
BUN: 10 mg/dL (ref 6–20)
CO2: 26 mmol/L (ref 22–32)
Calcium: 9.1 mg/dL (ref 8.9–10.3)
Chloride: 104 mmol/L (ref 98–111)
Creatinine, Ser: 0.81 mg/dL (ref 0.61–1.24)
GFR, Estimated: >60 mL/min (ref >60)
Glucose, Bld: 93 mg/dL (ref 70–99)
Potassium: 3.2 mmol/L — ABNORMAL LOW (ref 3.5–5.1)
Sodium: 140 mmol/L (ref 135–145)
Total Bilirubin: 0.8 mg/dL (ref 0.0–1.2)
Total Protein: 6.8 g/dL (ref 6.5–8.1)

## 2023-12-08 LAB — CBC WITH DIFFERENTIAL/PLATELET
Abs Immature Granulocytes: 0.01 10*3/uL (ref 0.00–0.07)
Basophils Absolute: 0 10*3/uL (ref 0.0–0.1)
Basophils Relative: 0 %
Eosinophils Absolute: 0.1 10*3/uL (ref 0.0–0.5)
Eosinophils Relative: 2 %
HCT: 38.4 % — ABNORMAL LOW (ref 39.0–52.0)
Hemoglobin: 14.3 g/dL (ref 13.0–17.0)
Immature Granulocytes: 0 %
Lymphocytes Relative: 38 %
Lymphs Abs: 2.7 10*3/uL (ref 0.7–4.0)
MCH: 31.9 pg (ref 26.0–34.0)
MCHC: 37.2 g/dL — ABNORMAL HIGH (ref 30.0–36.0)
MCV: 85.7 fL (ref 80.0–100.0)
Monocytes Absolute: 0.7 10*3/uL (ref 0.1–1.0)
Monocytes Relative: 10 %
Neutro Abs: 3.5 10*3/uL (ref 1.7–7.7)
Neutrophils Relative %: 50 %
Platelets: 311 10*3/uL (ref 150–400)
RBC: 4.48 MIL/uL (ref 4.22–5.81)
RDW: 12.6 % (ref 11.5–15.5)
WBC: 7 10*3/uL (ref 4.0–10.5)
nRBC: 0 % (ref 0.0–0.2)

## 2023-12-08 LAB — LIPASE, BLOOD: Lipase: 33 U/L (ref 11–51)

## 2023-12-08 MED ORDER — POTASSIUM CHLORIDE CRYS ER 20 MEQ PO TBCR
40.0000 meq | EXTENDED_RELEASE_TABLET | Freq: Once | ORAL | Status: AC
  Administered 2023-12-08: 40 meq via ORAL
  Filled 2023-12-08: qty 2

## 2023-12-08 MED ORDER — MORPHINE SULFATE (PF) 4 MG/ML IV SOLN
4.0000 mg | Freq: Once | INTRAVENOUS | Status: AC
  Administered 2023-12-08: 4 mg via INTRAVENOUS
  Filled 2023-12-08: qty 1

## 2023-12-08 MED ORDER — PANTOPRAZOLE SODIUM 40 MG PO TBEC: 40.0000 mg | DELAYED_RELEASE_TABLET | Freq: Every day | ORAL | 0 refills | Status: DC

## 2023-12-08 MED ORDER — ONDANSETRON HCL 4 MG/2ML IJ SOLN
4.0000 mg | Freq: Once | INTRAMUSCULAR | Status: AC
  Administered 2023-12-08: 4 mg via INTRAVENOUS
  Filled 2023-12-08: qty 2

## 2023-12-08 NOTE — ED Triage Notes (Signed)
 Pt here 6/18 for abd pain and was told he had an angry gallbladder but that it would subside. Pt here now due to worsening pain and new vomiting.

## 2023-12-08 NOTE — Discharge Instructions (Signed)
 You have been scheduled to return here tomorrow morning at 9 AM for an ultrasound of your abdomen.  Please arrive 10 to 15 minutes early to register.  Arrive with a full bladder.  I have also listed local GI provider that you may contact for follow-up if your ultrasound is negative

## 2023-12-08 NOTE — ED Provider Notes (Signed)
 [place] EMERGENCY DEPARTMENT AT [facility] Provider Note   CSN: [id number] Arrival date & time: 12/08/23  2121     Patient presents with: Abdominal Pain   Jeffrey Woods is a 29 y.o. male.    Abdominal Pain Associated symptoms: diarrhea and nausea   Associated symptoms: no chest pain, no constipation, no dysuria, no fever, no shortness of breath and no vomiting        Jeffrey Woods is a 28 y.o. male past medical history of hypertension.  Seen here in the ER on 12/05/2023 for abdominal pain.  Returns this evening complaining of continued pain and nausea.  He had labs urinalysis and CT of his abdomen and pelvis was felt to have possible gastroenteritis but his symptoms have not improved.  He states that his symptoms worsen when he attempts to eat any type of food.  No longer vomiting after being prescribed Zofran .  His abdominal pain worsens with deep breathing and movement as well.  Patient is concerned about his gallbladder as he states that his CT from his previous ER visit showed that he had may have had some sludge in his gallbladder.  His symptoms have been associated with loose mucus type stools.  No melena or hematochezia.  Denies alcohol use  Prior abdominal surgeries include appendectomy.  Prior to Admission medications   Medication Sig Start Date End Date Taking? Authorizing Provider  dicyclomine  (BENTYL ) 20 MG tablet Take 1 tablet (20 mg total) by mouth 2 (two) times daily. 11/18/20   Bero, Michael M, MD  enoxaparin  (LOVENOX ) 40 MG/0.4ML injection Inject 0.4 mLs (40 mg total) into the skin daily for 14 days. 10/17/19 10/31/19  Kit Rush, MD  naproxen  sodium (ALEVE ) 220 MG tablet Take 2 tablets (440 mg total) by mouth 2 (two) times daily with a meal. For 3-5 days after surgery. 10/16/19   Kit Rush, MD  olmesartan (BENICAR) 40 MG tablet Take 40 mg by mouth daily.    [provider]  ondansetron  (ZOFRAN  ODT) 4 MG disintegrating tablet Take 1 tablet  (4 mg total) by mouth every 8 (eight) hours as needed for nausea or vomiting. 11/18/20   Theadore Ozell HERO, MD  ondansetron  (ZOFRAN ) 4 MG tablet Take 1 tablet (4 mg total) by mouth every 6 (six) hours. 12/05/23   Simon Lavonia SAILOR, MD  verapamil  (CALAN -SR) 180 MG CR tablet Take 1 tablet (180 mg total) by mouth at bedtime. Patient not taking: Reported on 10/05/2019 05/26/19   Landy Honora CROME, PA-C    Allergies: Windell punter    Review of Systems  Constitutional:  Positive for appetite change. Negative for fever.  Respiratory:  Negative for shortness of breath.   Cardiovascular:  Negative for chest pain.  Gastrointestinal:  Positive for abdominal pain, diarrhea and nausea. Negative for constipation and vomiting.  Genitourinary:  Negative for dysuria.  Neurological:  Negative for dizziness and weakness.    Updated Vital Signs BP (!) 131/96   Pulse 91   Temp (!) 97.2 F (36.2 C) (Temporal)   Ht 6' (1.829 m)   Wt 108 kg   SpO2 100%   BMI 32.29 kg/m   Physical Exam Vitals and nursing note reviewed.  Constitutional:      General: He is not in acute distress.    Appearance: He is well-developed.  HENT:     Mouth/Throat:     Mouth: Mucous membranes are moist.     Pharynx: Oropharynx is clear.   Cardiovascular:  Rate and Rhythm: Normal rate and regular rhythm.     Pulses: Normal pulses.  Pulmonary:     Effort: Pulmonary effort is normal.     Breath sounds: Normal breath sounds.  Abdominal:     General: There is no distension.     Palpations: Abdomen is soft.     Tenderness: There is abdominal tenderness. There is no guarding or rebound.     Comments: Patient has diffuse right-sided and periumbilical tenderness on my exam.  There is mild tenderness of the epigastrium as well.  There is no guarding or rebound.  Abdomen is soft   Musculoskeletal:        General: Normal range of motion.   Skin:    General: Skin is warm.     Capillary Refill: Capillary refill takes less than 2  seconds.   Neurological:     General: No focal deficit present.     Mental Status: He is alert.     Sensory: No sensory deficit.     Motor: No weakness.     (all labs ordered are listed, but only abnormal results are displayed) Labs Reviewed  CBC WITH DIFFERENTIAL/PLATELET - Abnormal; Notable for the following components:      Result Value   HCT 38.4 (*)    MCHC 37.2 (*)    All other components within normal limits  COMPREHENSIVE METABOLIC PANEL WITH GFR - Abnormal; Notable for the following components:   Potassium 3.2 (*)    All other components within normal limits  LIPASE, BLOOD    EKG: None  Radiology: No results found.   Procedures   Medications Ordered in the ED  ondansetron  (ZOFRAN ) injection 4 mg (has no administration in time range)  morphine  (PF) 4 MG/ML injection 4 mg (has no administration in time range)                                    Medical Decision Making Patient here for continued abdominal pain.  He was seen here on 12/05/2023 for similar symptoms.  He had CT at that time that showed some sludge of his gallbladder with no CT evidence of gallstones or acute cholecystitis.  Patient continues to have nausea and diffuse right-sided abdominal pain on my exam no fever or chills pain is reported to worsen with deep breathing movement and with food intake.  Differential would include but not limited to acute cholecystitis, gastroenteritis, colitis, pancreatitis, SBO also considered but felt doubtful surgical history includes prior appendectomy   Amount and/or Complexity of Data Reviewed Labs: ordered.    Details: No evidence of leukocytosis, his LFTs are reassuring.  Mild hypokalemia with potassium of 3.2 this evening felt to be secondary to patient's recurrent diarrhea Radiology: ordered. Discussion of management or test interpretation with external provider(s): Since patient is having continued abdominal pain this seems to be more right-sided on my exam  but nonfocal.  He has reassuring LFTs he is afebrile and no leukocytosis this evening.  Patient is agreeable to return tomorrow for outpatient gallbladder ultrasound since ultrasound is unavailable at this time.  I will start him on a PPI as well.  He appears appropriate for discharge home and will also give follow-up information for GI.  Risk Prescription drug management.        Final diagnoses:  Generalized abdominal pain  Hypokalemia    ED Discharge Orders     None  Herlinda Milling, PA-C 12/08/23 2345    Dean Clarity, MD 12/09/23 [phone]

## 2023-12-09 ENCOUNTER — Ambulatory Visit (HOSPITAL_COMMUNITY)
Admission: RE | Admit: 2023-12-09 | Discharge: 2023-12-09 | Disposition: A | Discharge status: Home / Self Care | Source: Ambulatory Visit | Attending: Emergency Medicine | Admitting: Emergency Medicine

## 2023-12-09 DIAGNOSIS — K838 Other specified diseases of biliary tract: Secondary | ICD-10-CM | POA: Diagnosis not present

## 2023-12-09 DIAGNOSIS — K828 Other specified diseases of gallbladder: Secondary | ICD-10-CM | POA: Insufficient documentation

## 2023-12-09 DIAGNOSIS — R1011 Right upper quadrant pain: Secondary | ICD-10-CM | POA: Diagnosis not present

## 2023-12-09 NOTE — ED Provider Notes (Signed)
 Patient presents today for follow-up of ultrasound results, had outpatient ultrasound this morning, ultrasound shows likely fatty infiltration, no cholecystitis.  He states today for abdominal pain worse after eating, but no fevers or chills.  He is already been referred to GI after his appointment last night and he is going to follow-up with them.  He is well-appearing.  No change in symptoms.  Reiterated return precautions today.   Suellen Sherran DELENA DEVONNA 12/09/23 1012    Albertina Dixon, MD 12/10/23 1146

## 2023-12-11 ENCOUNTER — Encounter (INDEPENDENT_AMBULATORY_CARE_PROVIDER_SITE_OTHER): Payer: Self-pay | Admitting: Gastroenterology

## 2023-12-11 ENCOUNTER — Encounter: Payer: Self-pay | Admitting: *Deleted

## 2023-12-11 ENCOUNTER — Ambulatory Visit (INDEPENDENT_AMBULATORY_CARE_PROVIDER_SITE_OTHER): Admitting: Gastroenterology

## 2023-12-11 VITALS — BP 128/87 | HR 86 | Temp 97.3°F | Ht 73.0 in | Wt 231.2 lb

## 2023-12-11 DIAGNOSIS — R1011 Right upper quadrant pain: Secondary | ICD-10-CM | POA: Diagnosis not present

## 2023-12-11 DIAGNOSIS — R110 Nausea: Secondary | ICD-10-CM | POA: Diagnosis not present

## 2023-12-11 MED ORDER — SUCRALFATE 1 G PO TABS: 1.0000 g | ORAL_TABLET | Freq: Three times a day (TID) | ORAL | 0 refills | Status: DC

## 2023-12-11 MED ORDER — OMEPRAZOLE 40 MG PO CPDR: 40.0000 mg | DELAYED_RELEASE_CAPSULE | Freq: Every day | ORAL | 1 refills | Status: DC

## 2023-12-11 NOTE — Patient Instructions (Signed)
 We will get you scheduled for an upper endoscopy for further evaluation I have sent omeprazole 40mg  to take once daily, 30 minutes prior to first meal of the day I have also sent carafate 1g tablets, dissolve this in 1/2 oz water, mix and drink prior to meals and at bedtime  Please avoid NSAIDs (advil , aleve , naproxen , goody powder, ibuprofen ) as these can be very hard on your GI tract, causing inflammation, ulcers and damage to the lining of your GI tract.   Follow up 2 months  It was a pleasure to see you today. I want to create trusting relationships with patients and provide genuine, compassionate, and quality care. I truly value your feedback! please be on the lookout for a survey regarding your visit with me today. I appreciate your input about our visit and your time in completing this!    Roshawn Lacina L. Afton Mikelson, MSN, APRN, AGNP-C Adult-Gerontology Nurse Practitioner [facility] at [facility]
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
## 2023-12-12 ENCOUNTER — Emergency Department (HOSPITAL_COMMUNITY)
Admission: EM | Admit: 2023-12-12 | Discharge: 2023-12-12 | Disposition: A | Discharge status: Home / Self Care | Attending: Emergency Medicine | Admitting: Emergency Medicine

## 2023-12-12 ENCOUNTER — Encounter (HOSPITAL_COMMUNITY): Payer: Self-pay

## 2023-12-12 ENCOUNTER — Other Ambulatory Visit: Payer: Self-pay

## 2023-12-12 ENCOUNTER — Telehealth (INDEPENDENT_AMBULATORY_CARE_PROVIDER_SITE_OTHER): Payer: Self-pay

## 2023-12-12 ENCOUNTER — Encounter (INDEPENDENT_AMBULATORY_CARE_PROVIDER_SITE_OTHER): Payer: Self-pay

## 2023-12-12 ENCOUNTER — Encounter: Payer: Self-pay | Admitting: *Deleted

## 2023-12-12 ENCOUNTER — Encounter: Admitting: Gastroenterology

## 2023-12-12 DIAGNOSIS — Z79899 Other long term (current) drug therapy: Secondary | ICD-10-CM | POA: Diagnosis not present

## 2023-12-12 DIAGNOSIS — I10 Essential (primary) hypertension: Secondary | ICD-10-CM | POA: Insufficient documentation

## 2023-12-12 DIAGNOSIS — R1084 Generalized abdominal pain: Secondary | ICD-10-CM | POA: Insufficient documentation

## 2023-12-12 LAB — COMPREHENSIVE METABOLIC PANEL WITH GFR
ALT: 30 U/L (ref 0–44)
AST: 18 U/L (ref 15–41)
Albumin: 4.3 g/dL (ref 3.5–5.0)
Alkaline Phosphatase: 64 U/L (ref 38–126)
Anion gap: 9 (ref 5–15)
BUN: 13 mg/dL (ref 6–20)
CO2: 29 mmol/L (ref 22–32)
Calcium: 9.4 mg/dL (ref 8.9–10.3)
Chloride: 101 mmol/L (ref 98–111)
Creatinine, Ser: 0.92 mg/dL (ref 0.61–1.24)
GFR, Estimated: >60 mL/min (ref >60)
Glucose, Bld: 89 mg/dL (ref 70–99)
Potassium: 3.8 mmol/L (ref 3.5–5.1)
Sodium: 139 mmol/L (ref 135–145)
Total Bilirubin: 0.6 mg/dL (ref 0.0–1.2)
Total Protein: 7.1 g/dL (ref 6.5–8.1)

## 2023-12-12 LAB — URINALYSIS, ROUTINE W REFLEX MICROSCOPIC
Bilirubin Urine: NEGATIVE
Glucose, UA: NEGATIVE mg/dL
Hgb urine dipstick: NEGATIVE
Ketones, ur: NEGATIVE mg/dL
Leukocytes,Ua: NEGATIVE
Nitrite: NEGATIVE
Protein, ur: NEGATIVE mg/dL
Specific Gravity, Urine: 1.01 (ref 1.005–1.030)
pH: 5 (ref 5.0–8.0)

## 2023-12-12 LAB — CBC WITH DIFFERENTIAL/PLATELET
Abs Immature Granulocytes: 0.02 10*3/uL (ref 0.00–0.07)
Basophils Absolute: 0 10*3/uL (ref 0.0–0.1)
Basophils Relative: 0 %
Eosinophils Absolute: 0.1 10*3/uL (ref 0.0–0.5)
Eosinophils Relative: 2 %
HCT: 37.9 % — ABNORMAL LOW (ref 39.0–52.0)
Hemoglobin: 14.1 g/dL (ref 13.0–17.0)
Immature Granulocytes: 0 %
Lymphocytes Relative: 26 %
Lymphs Abs: 1.7 10*3/uL (ref 0.7–4.0)
MCH: 32 pg (ref 26.0–34.0)
MCHC: 37.2 g/dL — ABNORMAL HIGH (ref 30.0–36.0)
MCV: 86.1 fL (ref 80.0–100.0)
Monocytes Absolute: 0.6 10*3/uL (ref 0.1–1.0)
Monocytes Relative: 9 %
Neutro Abs: 4.2 10*3/uL (ref 1.7–7.7)
Neutrophils Relative %: 63 %
Platelets: 302 10*3/uL (ref 150–400)
RBC: 4.4 MIL/uL (ref 4.22–5.81)
RDW: 12.4 % (ref 11.5–15.5)
WBC: 6.6 10*3/uL (ref 4.0–10.5)
nRBC: 0 % (ref 0.0–0.2)

## 2023-12-12 LAB — LIPASE, BLOOD: Lipase: 29 U/L (ref 11–51)

## 2023-12-12 MED ORDER — FAMOTIDINE IN NACL 20-0.9 MG/50ML-% IV SOLN
20.0000 mg | Freq: Once | INTRAVENOUS | Status: AC
Start: 2023-12-12 — End: 2023-12-12
  Administered 2023-12-12: 20 mg via INTRAVENOUS
  Filled 2023-12-12: qty 50

## 2023-12-12 MED ORDER — HYOSCYAMINE SULFATE 0.125 MG SL SUBL: 0.2500 mg | SUBLINGUAL_TABLET | Freq: Once | SUBLINGUAL | Status: DC

## 2023-12-12 MED ORDER — ALUM & MAG HYDROXIDE-SIMETH 200-200-20 MG/5ML PO SUSP
30.0000 mL | Freq: Once | ORAL | Status: AC
  Administered 2023-12-12: 30 mL via ORAL
  Filled 2023-12-12: qty 30

## 2023-12-12 MED ORDER — DICYCLOMINE HCL 10 MG PO CAPS
10.0000 mg | ORAL_CAPSULE | Freq: Once | ORAL | Status: AC
  Administered 2023-12-12: 10 mg via ORAL
  Filled 2023-12-12: qty 1

## 2023-12-12 NOTE — Telephone Encounter (Signed)
 Noted

## 2023-12-12 NOTE — ED Triage Notes (Signed)
 Pt arrived via POV c/o recurrent lower abdominal pain. Pt seen here recently for same. Pt denies N/V/D.

## 2023-12-12 NOTE — Discharge Instructions (Signed)
 You were evaluated in the emergency room for abdominal pain.  Your lab work did not show any significant abnormality.  Please take the prescriptions as prescribed by your GI doctor.  Please avoid taking any more ibuprofen .
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
## 2023-12-12 NOTE — ED Provider Notes (Signed)
 Jeffrey Woods EMERGENCY DEPARTMENT AT [facility] Provider Note   CSN: [id number] Arrival date & time: 12/12/23  1216     Patient presents with: Abdominal Pain   Jeffrey Woods is a 28 y.o. male with past medical history of hypertension presents with complaints of abdominal pain that has been ongoing for the past 2 weeks.  Has been seen here multiple times for this.  Symptoms are not associated with nausea or vomiting.  No dysuria or increased frequency.  Pain is significantly worse right after he eats.  He does admit to excessive NSAID use, taken over 12 tablets for  a long time now.  Denies any melena or hematochezia.  Was evaluated by gastroenterology recently with plan for scope with this coming Tuesday.    Abdominal Pain      Prior to Admission medications   Medication Sig Start Date End Date Taking? Authorizing Provider  dicyclomine  (BENTYL ) 20 MG tablet Take 1 tablet (20 mg total) by mouth 2 (two) times daily. Patient not taking: Reported on 12/11/2023 11/18/20   Bero, Michael M, MD  ibuprofen  (ADVIL ) 200 MG tablet Take 200 mg by mouth every 6 (six) hours as needed.    [provider]  omeprazole (PRILOSEC) 40 MG capsule Take 1 capsule (40 mg total) by mouth daily. 12/11/23   Carlan, Chelsea L, NP  ondansetron  (ZOFRAN  ODT) 4 MG disintegrating tablet Take 1 tablet (4 mg total) by mouth every 8 (eight) hours as needed for nausea or vomiting. 11/18/20   Bero, Michael M, MD  sucralfate (CARAFATE) 1 g tablet Take 1 tablet (1 g total) by mouth 4 (four) times daily -  with meals and at bedtime for 21 days. Dissolve in 1/2 ounce of water, mix and drink. 12/11/23 01/01/24  Carlan, Mitzie CROME, NP  verapamil  (CALAN -SR) 180 MG CR tablet Take 1 tablet (180 mg total) by mouth at bedtime. Patient not taking: Reported on 12/11/2023 05/26/19   Landy Honora CROME, PA-C    Allergies: Windell punter    Review of Systems  Gastrointestinal:  Positive for abdominal pain.    Updated Vital  Signs BP 125/84   Pulse 68   Temp 98 F (36.7 C)   Resp 17   Ht 6' 1 (1.854 m)   Wt 104.9 kg   SpO2 96%   BMI 30.50 kg/m   Physical Exam Vitals and nursing note reviewed.  Constitutional:      General: He is not in acute distress.    Appearance: He is well-developed.  HENT:     Head: Normocephalic and atraumatic.   Eyes:     Conjunctiva/sclera: Conjunctivae normal.    Cardiovascular:     Rate and Rhythm: Normal rate and regular rhythm.     Heart sounds: No murmur heard. Pulmonary:     Effort: Pulmonary effort is normal. No respiratory distress.     Breath sounds: Normal breath sounds.  Abdominal:     Palpations: Abdomen is soft.     Tenderness: There is abdominal tenderness.     Comments: Epigastric abdominal tenderness, soft nondistended   Musculoskeletal:        General: No swelling.     Cervical back: Neck supple.   Skin:    General: Skin is warm and dry.     Capillary Refill: Capillary refill takes less than 2 seconds.   Neurological:     Mental Status: He is alert.   Psychiatric:        Mood and  Affect: Mood normal.     (all labs ordered are listed, but only abnormal results are displayed) Labs Reviewed  CBC WITH DIFFERENTIAL/PLATELET - Abnormal; Notable for the following components:      Result Value   HCT 37.9 (*)    MCHC 37.2 (*)    All other components within normal limits  COMPREHENSIVE METABOLIC PANEL WITH GFR  LIPASE, BLOOD  URINALYSIS, ROUTINE W REFLEX MICROSCOPIC    EKG: None  Radiology: No results found.   Procedures   Medications Ordered in the ED  alum & mag hydroxide-simeth (MAALOX/MYLANTA) 200-200-20 MG/5ML suspension 30 mL (30 mLs Oral Given 12/12/23 1339)  dicyclomine  (BENTYL ) capsule 10 mg (10 mg Oral Given 12/12/23 1338)  famotidine (PEPCID) IVPB 20 mg premix (0 mg Intravenous Stopped 12/12/23 1501)                                    Medical Decision Making Amount and/or Complexity of Data Reviewed Labs:  ordered.  Risk OTC drugs. Prescription drug management.   This patient presents to the ED with chief complaint(s) of abdominal pain.  The complaint involves an extensive differential diagnosis and also carries with it a high risk of complications and morbidity.   Pertinent past medical history as listed in HPI  The differential diagnosis includes  Cystitis, pyelonephritis, cholecystitis, ulcer Additional history obtained: Records reviewed Care Everywhere/External Records  Assessment and management:   Hemodynamically stable, nontoxic-appearing patient presented with complaints of abdominal pain.  Pain has been ongoing for the past few weeks.  He has been seen multiple times for this.  Most recent ultrasound 6/22 did demonstrate some small amount of sludge within the gallbladder without any signs of acute cholecystitis.  CT abdomen pelvis 6/18 demonstrated no acute findings.  He does admit to excessive NSAID use and GI plans to scope the patient next week.  He says he took ibuprofen  again today and his pain got worse, said he cannot wait till next week.  Patient's hemoglobin is stable he has no leukocytosis.  The remainder of his labs are unremarkable as well.  Do not feel that repeat imaging is indicated today.  On exam he does have generalized abdominal tenderness worse in the epigastric and left upper quadrant regions.  Overall exam and history are most concerning for PUD.  Will treat his pain here today with GI cocktail and plan to have him follow-up with GI.  Independent ECG interpretation:  none  Independent labs interpretation:  The following labs were independently interpreted:  CBC without significant abnormality, CMP unremarkable, lipase within normal limits  Independent visualization and interpretation of imaging: I independently visualized the following imaging with scope of interpretation limited to determining acute life threatening conditions related to emergency care: none     Consultations obtained:   none  Disposition:   Patient will be discharged home. The patient has been appropriately medically screened and/or stabilized in the ED. I have low suspicion for any other emergent medical condition which would require further screening, evaluation or treatment in the ED or require inpatient management. At time of discharge the patient is hemodynamically stable and in no acute distress. I have discussed work-up results and diagnosis with patient and answered all questions. Patient is agreeable with discharge plan. We discussed strict return precautions for returning to the emergency department and they verbalized understanding.     Social Determinants of Health:   none  This note was dictated with voice recognition software.  Despite best efforts at proofreading, errors may have occurred which can change the documentation meaning.       Final diagnoses:  Generalized abdominal pain    ED Discharge Orders     None          Donnajean Lynwood VEAR DEVONNA 12/12/23 1518    Yolande Lamar BROCKS, MD 12/13/23 1435

## 2023-12-13 ENCOUNTER — Other Ambulatory Visit: Payer: Self-pay

## 2023-12-13 ENCOUNTER — Encounter (HOSPITAL_COMMUNITY): Payer: Self-pay

## 2023-12-13 ENCOUNTER — Encounter (HOSPITAL_COMMUNITY)
Admission: RE | Admit: 2023-12-13 | Discharge: 2023-12-13 | Disposition: A | Discharge status: Home / Self Care | Source: Ambulatory Visit | Attending: Gastroenterology | Admitting: Gastroenterology

## 2023-12-13 VITALS — Ht 73.0 in | Wt 231.2 lb

## 2023-12-13 DIAGNOSIS — I10 Essential (primary) hypertension: Secondary | ICD-10-CM

## 2023-12-14 ENCOUNTER — Ambulatory Visit (HOSPITAL_COMMUNITY): Admitting: Anesthesiology

## 2023-12-14 ENCOUNTER — Ambulatory Visit (HOSPITAL_COMMUNITY)
Admission: RE | Admit: 2023-12-14 | Discharge: 2023-12-14 | Disposition: A | Discharge status: Home / Self Care | Source: Ambulatory Visit | Attending: Gastroenterology | Admitting: Gastroenterology

## 2023-12-14 ENCOUNTER — Encounter (HOSPITAL_COMMUNITY)
Admission: RE | Disposition: A | Discharge status: Home / Self Care | Payer: Self-pay | Source: Ambulatory Visit | Attending: Gastroenterology

## 2023-12-14 ENCOUNTER — Encounter (HOSPITAL_COMMUNITY): Payer: Self-pay | Admitting: Gastroenterology

## 2023-12-14 ENCOUNTER — Other Ambulatory Visit (HOSPITAL_COMMUNITY)

## 2023-12-14 DIAGNOSIS — K760 Fatty (change of) liver, not elsewhere classified: Secondary | ICD-10-CM | POA: Insufficient documentation

## 2023-12-14 DIAGNOSIS — R197 Diarrhea, unspecified: Secondary | ICD-10-CM | POA: Insufficient documentation

## 2023-12-14 DIAGNOSIS — N289 Disorder of kidney and ureter, unspecified: Secondary | ICD-10-CM | POA: Insufficient documentation

## 2023-12-14 DIAGNOSIS — R1011 Right upper quadrant pain: Secondary | ICD-10-CM | POA: Diagnosis not present

## 2023-12-14 DIAGNOSIS — I10 Essential (primary) hypertension: Secondary | ICD-10-CM | POA: Diagnosis not present

## 2023-12-14 DIAGNOSIS — Z9049 Acquired absence of other specified parts of digestive tract: Secondary | ICD-10-CM | POA: Insufficient documentation

## 2023-12-14 DIAGNOSIS — R110 Nausea: Secondary | ICD-10-CM | POA: Diagnosis not present

## 2023-12-14 DIAGNOSIS — R1013 Epigastric pain: Secondary | ICD-10-CM | POA: Diagnosis not present

## 2023-12-14 DIAGNOSIS — K319 Disease of stomach and duodenum, unspecified: Secondary | ICD-10-CM | POA: Diagnosis not present

## 2023-12-14 DIAGNOSIS — R109 Unspecified abdominal pain: Secondary | ICD-10-CM

## 2023-12-14 DIAGNOSIS — K259 Gastric ulcer, unspecified as acute or chronic, without hemorrhage or perforation: Secondary | ICD-10-CM | POA: Insufficient documentation

## 2023-12-14 DIAGNOSIS — K2950 Unspecified chronic gastritis without bleeding: Secondary | ICD-10-CM | POA: Diagnosis not present

## 2023-12-14 DIAGNOSIS — R1031 Right lower quadrant pain: Secondary | ICD-10-CM | POA: Diagnosis not present

## 2023-12-14 HISTORY — PX: ESOPHAGOGASTRODUODENOSCOPY: SHX5428

## 2023-12-14 SURGERY — EGD (ESOPHAGOGASTRODUODENOSCOPY)
Anesthesia: General

## 2023-12-14 MED ORDER — LIDOCAINE 2% (20 MG/ML) 5 ML SYRINGE
INTRAMUSCULAR | Status: DC | PRN
  Administered 2023-12-14: 100 mg via INTRAVENOUS

## 2023-12-14 MED ORDER — DICYCLOMINE HCL 20 MG PO TABS: 20.0000 mg | ORAL_TABLET | Freq: Two times a day (BID) | ORAL | 1 refills | Status: AC | PRN

## 2023-12-14 MED ORDER — DEXMEDETOMIDINE HCL IN NACL 80 MCG/20ML IV SOLN
INTRAVENOUS | Status: DC | PRN
Start: 2023-12-14 — End: 2023-12-14
  Administered 2023-12-14: 8 ug via INTRAVENOUS

## 2023-12-14 MED ORDER — OMEPRAZOLE 40 MG PO CPDR: 40.0000 mg | DELAYED_RELEASE_CAPSULE | Freq: Two times a day (BID) | ORAL | 1 refills | Status: AC

## 2023-12-14 MED ORDER — SUCRALFATE 1 G PO TABS: 1.0000 g | ORAL_TABLET | Freq: Three times a day (TID) | ORAL | 0 refills | Status: AC

## 2023-12-14 MED ORDER — DICYCLOMINE HCL 10 MG PO CAPS: 10.0000 mg | ORAL_CAPSULE | Freq: Two times a day (BID) | ORAL | 5 refills | Status: AC

## 2023-12-14 MED ORDER — LACTATED RINGERS IV SOLN: INTRAVENOUS | Status: DC | PRN

## 2023-12-14 MED ORDER — PROPOFOL 10 MG/ML IV BOLUS
INTRAVENOUS | Status: DC | PRN
  Administered 2023-12-14 (×3): 50 mg via INTRAVENOUS

## 2023-12-14 MED ORDER — PROPOFOL 500 MG/50ML IV EMUL
INTRAVENOUS | Status: DC | PRN
Start: 2023-12-14 — End: 2023-12-14
  Administered 2023-12-14: 150 ug/kg/min via INTRAVENOUS

## 2023-12-14 NOTE — Anesthesia Postprocedure Evaluation (Signed)
 Anesthesia Post Note  Patient: Jeffrey Woods  Procedure(s) Performed: EGD (ESOPHAGOGASTRODUODENOSCOPY)  Patient location during evaluation: Phase II Anesthesia Type: General Level of consciousness: awake Pain management: pain level controlled Vital Signs Assessment: post-procedure vital signs reviewed and stable Respiratory status: spontaneous breathing and respiratory function stable Cardiovascular status: blood pressure returned to baseline and stable Postop Assessment: no headache and no apparent nausea or vomiting Anesthetic complications: no Comments: Late entry   No notable events documented.   Last Vitals:  Vitals:   12/14/23 0842 12/14/23 1021  BP: (!) 146/83 109/72  Pulse:  74  Resp: 16 13  Temp: 37.3 C 37 C  SpO2: 100% 99%    Last Pain:  Vitals:   12/14/23 1021  TempSrc: Oral  PainSc: 8                  Yvonna JINNY Bosworth

## 2023-12-14 NOTE — Transfer of Care (Signed)
 Immediate Anesthesia Transfer of Care Note  Patient: Jeffrey Woods  Procedure(s) Performed: EGD (ESOPHAGOGASTRODUODENOSCOPY)  Patient Location: PACU  Anesthesia Type:MAC  Level of Consciousness: awake and alert   Airway & Oxygen Therapy: Patient Spontanous Breathing and Patient connected to nasal cannula oxygen  Post-op Assessment: Report given to RN and Post -op Vital signs reviewed and stable  Post vital signs: Reviewed and stable  Last Vitals:  Vitals Value Taken Time  BP 109/72 12/14/23 10:21  Temp 37 C 12/14/23 10:21  Pulse 74 12/14/23 10:21  Resp 13 12/14/23 10:21  SpO2 99 % 12/14/23 10:21    Last Pain:  Vitals:   12/14/23 1021  TempSrc: Oral  PainSc: 8       Patients Stated Pain Goal: 6 (12/14/23 0842)  Complications: No notable events documented.

## 2023-12-14 NOTE — Anesthesia Preprocedure Evaluation (Signed)
 Anesthesia Evaluation  Patient identified by MRN, date of birth, ID band Patient awake    Reviewed: Allergy & Precautions, H&P , NPO status , Patient's Chart, lab work & pertinent test results, reviewed documented beta blocker date and time   Airway Mallampati: II  TM Distance: >3 FB Neck ROM: full    Dental no notable dental hx.    Pulmonary neg pulmonary ROS, former smoker   Pulmonary exam normal breath sounds clear to auscultation       Cardiovascular Exercise Tolerance: Good hypertension, negative cardio ROS  Rhythm:regular Rate:Normal     Neuro/Psych negative neurological ROS  negative psych ROS   GI/Hepatic negative GI ROS, Neg liver ROS,,,  Endo/Other  negative endocrine ROS    Renal/GU Renal diseasenegative Renal ROS  negative genitourinary   Musculoskeletal   Abdominal   Peds  Hematology negative hematology ROS (+)   Anesthesia Other Findings   Reproductive/Obstetrics negative OB ROS                             Anesthesia Physical Anesthesia Plan  ASA: 2  Anesthesia Plan: General   Post-op Pain Management:    Induction:   PONV Risk Score and Plan: Propofol  infusion  Airway Management Planned:   Additional Equipment:   Intra-op Plan:   Post-operative Plan:   Informed Consent: I have reviewed the patients History and Physical, chart, labs and discussed the procedure including the risks, benefits and alternatives for the proposed anesthesia with the patient or authorized representative who has indicated his/her understanding and acceptance.     Dental Advisory Given  Plan Discussed with: CRNA  Anesthesia Plan Comments:        Anesthesia Quick Evaluation

## 2023-12-14 NOTE — Op Note (Signed)
 [facility] Patient Name: Jeffrey Woods Procedure Date: 12/14/2023 9:40 AM MRN: [id number] Date of Birth: [birth year] Attending MD: Toribio Fortune , , [id number] CSN: [id number] Age: 28 Admit Type: Outpatient Procedure:                Upper GI endoscopy Indications:              Lower abdominal pain, Diarrhea Providers:                Toribio Fortune, Olam Ada, RN, Jon Loge Referring MD:              Medicines:                Monitored Anesthesia Care Complications:            No immediate complications. Estimated Blood Loss:     Estimated blood loss: none. Procedure:                Pre-Anesthesia Assessment:                           - Prior to the procedure, a History and Physical                            was performed, and patient medications, allergies                            and sensitivities were reviewed. The patient's                            tolerance of previous anesthesia was reviewed.                           - The risks and benefits of the procedure and the                            sedation options and risks were discussed with the                            patient. All questions were answered and informed                            consent was obtained.                           - ASA Grade Assessment: I - A normal, healthy                            patient.                           After obtaining informed consent, the endoscope was                            passed under direct vision. Throughout the                            procedure, the patient's blood pressure,  pulse, and                            oxygen saturations were monitored continuously. The                            [id number]-H190 ([id number]) scope was introduced through the                            mouth, and advanced to the second part of duodenum.                            The upper GI endoscopy was accomplished without                            difficulty. The patient  tolerated the procedure                            well. Scope In: 10:02:36 AM Scope Out: 10:10:30 AM Total Procedure Duration: 0 hours 7 minutes 54 seconds  Findings:      The Z-line was regular and was found 40 cm from the incisors.      The esophagus was normal.      One non-bleeding superficial gastric ulcer with a clean ulcer base       (Forrest Class III) was found in the gastric antrum. The lesion was 8 mm       in largest dimension. Biopsies were taken with a cold forceps for       histology. Biopsies were also taken from the rest of the stomach with a       cold forceps for H. pylori testing.      The examined duodenum was normal. Biopsies were taken with a cold       forceps for histology. Impression:               - Z-line regular, 40 cm from the incisors.                           - Normal esophagus.                           - Non-bleeding gastric ulcer with a clean ulcer                            base (Forrest Class III). Biopsied.                           - Normal examined duodenum. Biopsied. Moderate Sedation:      Per Anesthesia Care Recommendation:           - Discharge patient to home (ambulatory).                           - Resume previous diet.                           - Use Prilosec (omeprazole ) 40 mg PO BID for 3  months.                           - Use sucralfate  tablets 1 gram PO QID.                           - Can take dicyclomine  as needed for abdominal pain.                           - Await pathology results.                           - No high dose aspirin (Goody/BC powder),                            ibuprofen , naproxen , or other non-steroidal                            anti-inflammatory drugs.                           - If recurrent diarrhea, will perform stool testing                            for infections. Procedure Code(s):        --- Professional ---                           [phone], Esophagogastroduodenoscopy,  flexible,                            transoral; with biopsy, single or multiple Diagnosis Code(s):        --- Professional ---                           K25.9, Gastric ulcer, unspecified as acute or                            chronic, without hemorrhage or perforation                           R10.30, Lower abdominal pain, unspecified                           R19.7, Diarrhea, unspecified CPT copyright 2022 American Medical Association. All rights reserved. The codes documented in this report are preliminary and upon coder review may  be revised to meet current compliance requirements. Toribio Fortune, MD Toribio Fortune,  12/14/2023 10:20:30 AM This report has been signed electronically. Number of Addenda: 0

## 2023-12-14 NOTE — Discharge Instructions (Addendum)
 You are being discharged to home.  Resume your previous diet.  Take Prilosec (omeprazole ) 40 mg by mouth twice a day for three months.  Take Carafate  (sucralfate ) tablets 1 gram by mouth four times a day.  We are waiting for your pathology results.  Do not take any high dose aspirin  (Goody/BC powder), ibuprofen  (including Advil , Motrin  or Nuprin ), naproxen  (including Aleve ), or any other non-steroidal anti-inflammatory drugs.  Can take dicyclomine  as needed for abdominal pain. If recurrent diarrhea, please notify our office to perform stool testing for infections.

## 2023-12-14 NOTE — Interval H&P Note (Signed)
 History and Physical Interval Note:  12/14/2023 7:59 AM  Jeffrey Woods  has presented today for surgery, with the diagnosis of ruq pain, epigastric pain, nausea.  The various methods of treatment have been discussed with the patient and family. After consideration of risks, benefits and other options for treatment, the patient has consented to  Procedure(s) with comments: EGD (ESOPHAGOGASTRODUODENOSCOPY) (N/A) - 11:45AM, ASA 1 as a surgical intervention.  The patient's history has been reviewed, patient examined, no change in status, stable for surgery.  I have reviewed the patient's chart and labs.  Questions were answered to the patient's satisfaction.     Cordell Guercio Castaneda Mayorga

## 2023-12-16 ENCOUNTER — Ambulatory Visit (INDEPENDENT_AMBULATORY_CARE_PROVIDER_SITE_OTHER): Payer: Self-pay | Admitting: Gastroenterology

## 2023-12-17 ENCOUNTER — Encounter (HOSPITAL_COMMUNITY): Payer: Self-pay | Admitting: Gastroenterology

## 2023-12-17 LAB — SURGICAL PATHOLOGY

## 2023-12-18 NOTE — Progress Notes (Signed)
 Patient result letter mailed No PCP

## 2023-12-27 ENCOUNTER — Encounter (INDEPENDENT_AMBULATORY_CARE_PROVIDER_SITE_OTHER): Payer: Self-pay | Admitting: Gastroenterology

## 2023-12-27 NOTE — Telephone Encounter (Signed)
 What dates are we writing him out and I will have Devere get it typed up and he can pick up from the office. Please advise. Thanks

## 2023-12-27 NOTE — Telephone Encounter (Signed)
 Devere would you mind please typing up a work note for the patient below and letting him know when it is ready so he can pick it up from the office please? Thank you!

## 2023-12-31 ENCOUNTER — Encounter (INDEPENDENT_AMBULATORY_CARE_PROVIDER_SITE_OTHER): Payer: Self-pay | Admitting: Gastroenterology

## 2023-12-31 ENCOUNTER — Telehealth (INDEPENDENT_AMBULATORY_CARE_PROVIDER_SITE_OTHER): Payer: Self-pay | Admitting: Gastroenterology

## 2023-12-31 NOTE — Telephone Encounter (Signed)
 noted

## 2023-12-31 NOTE — Telephone Encounter (Signed)
 The letter would be dated on 6/27, as this was the date he has his last endoscopy.  Devere, please send him a letter via MyChart similar to the note he had before, stating he is released to go to work starting on 6/28 (he had procedure on 6/27),  Thanks

## 2023-12-31 NOTE — Telephone Encounter (Signed)
 Pt called in and is needing a note releasing him to go back to work. Pt states it can be dated for today. Please advise. Thank you.  Pt states we can put letter on MyChart and pt can email to his manager

## 2024-02-12 ENCOUNTER — Ambulatory Visit (INDEPENDENT_AMBULATORY_CARE_PROVIDER_SITE_OTHER): Admitting: Gastroenterology

## 2024-02-12 ENCOUNTER — Encounter (INDEPENDENT_AMBULATORY_CARE_PROVIDER_SITE_OTHER): Payer: Self-pay | Admitting: Gastroenterology

## 2024-04-02 ENCOUNTER — Encounter (INDEPENDENT_AMBULATORY_CARE_PROVIDER_SITE_OTHER): Payer: Self-pay | Admitting: Gastroenterology
